# Patient Record
Sex: Male | Born: 1935 | Race: Black or African American | Hispanic: No | Marital: Married | Smoking: Former smoker
Health system: Southern US, Community
[De-identification: ages and names within clinical notes are randomized; demographics above are authoritative.]

## PROBLEM LIST (undated history)

## (undated) DIAGNOSIS — C3490 Malignant neoplasm of unspecified part of unspecified bronchus or lung: Secondary | ICD-10-CM

## (undated) DIAGNOSIS — G4733 Obstructive sleep apnea (adult) (pediatric): Secondary | ICD-10-CM

## (undated) DIAGNOSIS — I10 Essential (primary) hypertension: Secondary | ICD-10-CM

## (undated) DIAGNOSIS — M1990 Unspecified osteoarthritis, unspecified site: Secondary | ICD-10-CM

## (undated) DIAGNOSIS — I509 Heart failure, unspecified: Secondary | ICD-10-CM

## (undated) DIAGNOSIS — R1310 Dysphagia, unspecified: Secondary | ICD-10-CM

## (undated) DIAGNOSIS — E039 Hypothyroidism, unspecified: Secondary | ICD-10-CM

## (undated) DIAGNOSIS — N400 Enlarged prostate without lower urinary tract symptoms: Secondary | ICD-10-CM

## (undated) DIAGNOSIS — N189 Chronic kidney disease, unspecified: Secondary | ICD-10-CM

## (undated) DIAGNOSIS — K219 Gastro-esophageal reflux disease without esophagitis: Secondary | ICD-10-CM

## (undated) DIAGNOSIS — D649 Anemia, unspecified: Secondary | ICD-10-CM

## (undated) DIAGNOSIS — M109 Gout, unspecified: Secondary | ICD-10-CM

## (undated) DIAGNOSIS — E785 Hyperlipidemia, unspecified: Secondary | ICD-10-CM

## (undated) HISTORY — DX: Chronic kidney disease, unspecified: N18.9

## (undated) HISTORY — DX: Hyperlipidemia, unspecified: E78.5

## (undated) HISTORY — DX: Heart failure, unspecified: I50.9

## (undated) HISTORY — DX: Unspecified osteoarthritis, unspecified site: M19.90

## (undated) HISTORY — PX: CARDIAC CATHETERIZATION: SHX172

## (undated) HISTORY — PX: OTHER SURGICAL HISTORY: SHX169

## (undated) HISTORY — DX: Gout, unspecified: M10.9

## (undated) HISTORY — PX: THYROIDECTOMY: SHX17

## (undated) HISTORY — DX: Benign prostatic hyperplasia without lower urinary tract symptoms: N40.0

## (undated) HISTORY — DX: Essential (primary) hypertension: I10

## (undated) HISTORY — DX: Gastro-esophageal reflux disease without esophagitis: K21.9

## (undated) HISTORY — DX: Hypothyroidism, unspecified: E03.9

## (undated) HISTORY — DX: Obstructive sleep apnea (adult) (pediatric): G47.33

## (undated) HISTORY — DX: Malignant neoplasm of unspecified part of unspecified bronchus or lung: C34.90

## (undated) HISTORY — DX: Dysphagia, unspecified: R13.10

## (undated) HISTORY — PX: BILATERAL CARPAL TUNNEL RELEASE: SHX6508

## (undated) HISTORY — DX: Anemia, unspecified: D64.9

---

## 2005-05-19 ENCOUNTER — Ambulatory Visit: Payer: Self-pay | Admitting: Urology

## 2005-08-18 ENCOUNTER — Ambulatory Visit: Payer: Self-pay | Admitting: Otolaryngology

## 2005-10-28 ENCOUNTER — Ambulatory Visit: Payer: Self-pay | Admitting: Family Medicine

## 2005-11-20 ENCOUNTER — Ambulatory Visit: Payer: Self-pay | Admitting: Family Medicine

## 2005-12-08 ENCOUNTER — Ambulatory Visit: Payer: Self-pay | Admitting: Gastroenterology

## 2006-09-29 ENCOUNTER — Ambulatory Visit: Payer: Self-pay | Admitting: Family Medicine

## 2006-10-11 ENCOUNTER — Other Ambulatory Visit: Payer: Self-pay

## 2006-10-11 ENCOUNTER — Inpatient Hospital Stay: Payer: Self-pay | Admitting: Internal Medicine

## 2006-10-17 ENCOUNTER — Ambulatory Visit: Payer: Self-pay | Admitting: Gastroenterology

## 2007-02-09 ENCOUNTER — Ambulatory Visit: Payer: Self-pay | Admitting: Family Medicine

## 2007-04-05 ENCOUNTER — Ambulatory Visit: Payer: Self-pay | Admitting: Gastroenterology

## 2007-05-25 ENCOUNTER — Ambulatory Visit: Payer: Self-pay | Admitting: Family Medicine

## 2008-04-24 ENCOUNTER — Ambulatory Visit: Payer: Self-pay | Admitting: Otolaryngology

## 2008-05-02 ENCOUNTER — Ambulatory Visit: Payer: Self-pay | Admitting: Otolaryngology

## 2008-08-05 ENCOUNTER — Inpatient Hospital Stay: Payer: Self-pay | Admitting: Internal Medicine

## 2010-10-14 ENCOUNTER — Ambulatory Visit: Payer: Self-pay | Admitting: Vascular Surgery

## 2010-10-21 ENCOUNTER — Ambulatory Visit: Payer: Self-pay | Admitting: Vascular Surgery

## 2010-11-04 ENCOUNTER — Inpatient Hospital Stay: Payer: Self-pay | Admitting: Vascular Surgery

## 2011-05-26 ENCOUNTER — Ambulatory Visit: Payer: Self-pay | Admitting: Physical Medicine and Rehabilitation

## 2012-04-12 ENCOUNTER — Ambulatory Visit: Payer: Self-pay | Admitting: Otolaryngology

## 2012-06-26 ENCOUNTER — Inpatient Hospital Stay: Payer: Self-pay | Admitting: Student

## 2012-06-26 LAB — CBC WITH DIFFERENTIAL/PLATELET
Basophil %: 0.5 %
Eosinophil #: 1.3 10*3/uL — ABNORMAL HIGH (ref 0.0–0.7)
Eosinophil %: 13.1 %
HGB: 11.1 g/dL — ABNORMAL LOW (ref 13.0–18.0)
Lymphocyte #: 1.3 10*3/uL (ref 1.0–3.6)
MCH: 32.3 pg (ref 26.0–34.0)
MCHC: 33.4 g/dL (ref 32.0–36.0)
MCV: 97 fL (ref 80–100)
Monocyte #: 0.7 x10 3/mm (ref 0.2–1.0)
RBC: 3.43 10*6/uL — ABNORMAL LOW (ref 4.40–5.90)

## 2012-06-26 LAB — BASIC METABOLIC PANEL
Anion Gap: 7 (ref 7–16)
BUN: 37 mg/dL — ABNORMAL HIGH (ref 7–18)
Creatinine: 1.62 mg/dL — ABNORMAL HIGH (ref 0.60–1.30)
EGFR (African American): 44 — ABNORMAL LOW
EGFR (Non-African Amer.): 38 — ABNORMAL LOW
Glucose: 104 mg/dL — ABNORMAL HIGH (ref 65–99)
Osmolality: 294 (ref 275–301)
Potassium: 3.7 mmol/L (ref 3.5–5.1)

## 2012-09-28 ENCOUNTER — Ambulatory Visit: Payer: Self-pay | Admitting: Gastroenterology

## 2012-10-25 ENCOUNTER — Ambulatory Visit: Payer: Self-pay | Admitting: Otolaryngology

## 2013-02-26 ENCOUNTER — Other Ambulatory Visit: Payer: Self-pay | Admitting: Gastroenterology

## 2013-02-26 DIAGNOSIS — R1310 Dysphagia, unspecified: Secondary | ICD-10-CM

## 2013-03-13 ENCOUNTER — Ambulatory Visit
Admission: RE | Admit: 2013-03-13 | Discharge: 2013-03-13 | Disposition: A | Discharge status: Home / Self Care | Payer: Medicare Other | Source: Ambulatory Visit | Attending: Gastroenterology | Admitting: Gastroenterology

## 2013-03-13 DIAGNOSIS — R1310 Dysphagia, unspecified: Secondary | ICD-10-CM

## 2013-03-28 ENCOUNTER — Ambulatory Visit: Payer: Self-pay | Admitting: Family

## 2013-04-16 ENCOUNTER — Encounter: Payer: Self-pay | Admitting: Family

## 2013-04-18 ENCOUNTER — Encounter: Payer: Self-pay | Admitting: Family

## 2013-05-19 ENCOUNTER — Emergency Department: Payer: Self-pay | Admitting: Emergency Medicine

## 2013-05-19 LAB — URINALYSIS, COMPLETE
Bilirubin,UR: NEGATIVE
Ketone: NEGATIVE
Nitrite: NEGATIVE
Ph: 5 (ref 4.5–8.0)
Protein: 30
WBC UR: 976 /HPF (ref 0–5)

## 2013-05-19 LAB — CBC WITH DIFFERENTIAL/PLATELET
Basophil #: 0.1 10*3/uL (ref 0.0–0.1)
Eosinophil %: 2.6 %
Lymphocyte #: 1.8 10*3/uL (ref 1.0–3.6)
MCV: 93 fL (ref 80–100)
Monocyte #: 1.1 x10 3/mm — ABNORMAL HIGH (ref 0.2–1.0)
Monocyte %: 9.2 %
Neutrophil %: 71.8 %
Platelet: 104 10*3/uL — ABNORMAL LOW (ref 150–440)
RBC: 3.96 10*6/uL — ABNORMAL LOW (ref 4.40–5.90)
RDW: 15.9 % — ABNORMAL HIGH (ref 11.5–14.5)
WBC: 11.5 10*3/uL — ABNORMAL HIGH (ref 3.8–10.6)

## 2013-05-19 LAB — BASIC METABOLIC PANEL
Anion Gap: 5 — ABNORMAL LOW (ref 7–16)
BUN: 49 mg/dL — ABNORMAL HIGH (ref 7–18)
Calcium, Total: 9.1 mg/dL (ref 8.5–10.1)
Chloride: 104 mmol/L (ref 98–107)
Co2: 29 mmol/L (ref 21–32)
Creatinine: 2.1 mg/dL — ABNORMAL HIGH (ref 0.60–1.30)
EGFR (African American): 32 — ABNORMAL LOW
EGFR (Non-African Amer.): 27 — ABNORMAL LOW
Glucose: 84 mg/dL (ref 65–99)
Osmolality: 288 (ref 275–301)
Potassium: 4.2 mmol/L (ref 3.5–5.1)
Sodium: 138 mmol/L (ref 136–145)

## 2013-08-15 ENCOUNTER — Ambulatory Visit: Payer: Self-pay | Admitting: Cardiology

## 2013-08-15 LAB — CBC WITH DIFFERENTIAL/PLATELET
BASOS ABS: 0.1 10*3/uL (ref 0.0–0.1)
Basophil %: 0.5 %
EOS PCT: 1.8 %
Eosinophil #: 0.2 10*3/uL (ref 0.0–0.7)
HCT: 43.2 % (ref 40.0–52.0)
HGB: 14 g/dL (ref 13.0–18.0)
Lymphocyte #: 2.1 10*3/uL (ref 1.0–3.6)
Lymphocyte %: 18.2 %
MCH: 31.1 pg (ref 26.0–34.0)
MCHC: 32.4 g/dL (ref 32.0–36.0)
MCV: 96 fL (ref 80–100)
MONO ABS: 0.9 x10 3/mm (ref 0.2–1.0)
MONOS PCT: 7.8 %
NEUTROS ABS: 8.2 10*3/uL — AB (ref 1.4–6.5)
Neutrophil %: 71.7 %
PLATELETS: 104 10*3/uL — AB (ref 150–440)
RBC: 4.5 10*6/uL (ref 4.40–5.90)
RDW: 16.1 % — ABNORMAL HIGH (ref 11.5–14.5)
WBC: 11.4 10*3/uL — ABNORMAL HIGH (ref 3.8–10.6)

## 2013-08-15 LAB — BASIC METABOLIC PANEL
ANION GAP: 8 (ref 7–16)
BUN: 29 mg/dL — ABNORMAL HIGH (ref 7–18)
CHLORIDE: 106 mmol/L (ref 98–107)
CREATININE: 1.36 mg/dL — AB (ref 0.60–1.30)
Calcium, Total: 9.5 mg/dL (ref 8.5–10.1)
Co2: 26 mmol/L (ref 21–32)
EGFR (African American): 54 — ABNORMAL LOW
EGFR (Non-African Amer.): 46 — ABNORMAL LOW
GLUCOSE: 95 mg/dL (ref 65–99)
Osmolality: 285 (ref 275–301)
Potassium: 4.2 mmol/L (ref 3.5–5.1)
Sodium: 140 mmol/L (ref 136–145)

## 2013-08-16 LAB — CBC WITH DIFFERENTIAL/PLATELET
BASOS PCT: 0.4 %
Basophil #: 0 10*3/uL (ref 0.0–0.1)
Eosinophil #: 0.2 10*3/uL (ref 0.0–0.7)
Eosinophil %: 1.9 %
HCT: 40.9 % (ref 40.0–52.0)
HGB: 13.5 g/dL (ref 13.0–18.0)
LYMPHS ABS: 1.1 10*3/uL (ref 1.0–3.6)
Lymphocyte %: 9.8 %
MCH: 31.7 pg (ref 26.0–34.0)
MCHC: 33.1 g/dL (ref 32.0–36.0)
MCV: 96 fL (ref 80–100)
Monocyte #: 0.9 x10 3/mm (ref 0.2–1.0)
Monocyte %: 7.6 %
Neutrophil #: 9.2 10*3/uL — ABNORMAL HIGH (ref 1.4–6.5)
Neutrophil %: 80.3 %
Platelet: 112 10*3/uL — ABNORMAL LOW (ref 150–440)
RBC: 4.26 10*6/uL — AB (ref 4.40–5.90)
RDW: 16.2 % — ABNORMAL HIGH (ref 11.5–14.5)
WBC: 11.5 10*3/uL — ABNORMAL HIGH (ref 3.8–10.6)

## 2013-08-16 LAB — BASIC METABOLIC PANEL
Anion Gap: 4 — ABNORMAL LOW (ref 7–16)
BUN: 26 mg/dL — ABNORMAL HIGH (ref 7–18)
CALCIUM: 9.1 mg/dL (ref 8.5–10.1)
CREATININE: 1.52 mg/dL — AB (ref 0.60–1.30)
Chloride: 106 mmol/L (ref 98–107)
Co2: 28 mmol/L (ref 21–32)
EGFR (African American): 47 — ABNORMAL LOW
EGFR (Non-African Amer.): 41 — ABNORMAL LOW
GLUCOSE: 102 mg/dL — AB (ref 65–99)
Osmolality: 281 (ref 275–301)
Potassium: 4.1 mmol/L (ref 3.5–5.1)
Sodium: 138 mmol/L (ref 136–145)

## 2013-10-01 ENCOUNTER — Inpatient Hospital Stay: Payer: Self-pay | Admitting: Internal Medicine

## 2013-10-01 LAB — CBC
HCT: 38.8 % — ABNORMAL LOW (ref 40.0–52.0)
HGB: 12.5 g/dL — AB (ref 13.0–18.0)
MCH: 30.3 pg (ref 26.0–34.0)
MCHC: 32.2 g/dL (ref 32.0–36.0)
MCV: 94 fL (ref 80–100)
PLATELETS: 134 10*3/uL — AB (ref 150–440)
RBC: 4.11 10*6/uL — ABNORMAL LOW (ref 4.40–5.90)
RDW: 16.3 % — ABNORMAL HIGH (ref 11.5–14.5)
WBC: 13.3 10*3/uL — ABNORMAL HIGH (ref 3.8–10.6)

## 2013-10-01 LAB — CK-MB
CK-MB: 3.3 ng/mL (ref 0.5–3.6)
CK-MB: 3.5 ng/mL (ref 0.5–3.6)
CK-MB: 3 ng/mL (ref 0.5–3.6)

## 2013-10-01 LAB — BASIC METABOLIC PANEL
Anion Gap: 6 — ABNORMAL LOW (ref 7–16)
BUN: 23 mg/dL — ABNORMAL HIGH (ref 7–18)
CREATININE: 1.51 mg/dL — AB (ref 0.60–1.30)
Calcium, Total: 8.9 mg/dL (ref 8.5–10.1)
Chloride: 108 mmol/L — ABNORMAL HIGH (ref 98–107)
Co2: 28 mmol/L (ref 21–32)
EGFR (African American): 47 — ABNORMAL LOW
EGFR (Non-African Amer.): 41 — ABNORMAL LOW
Glucose: 148 mg/dL — ABNORMAL HIGH (ref 65–99)
OSMOLALITY: 290 (ref 275–301)
Potassium: 3.9 mmol/L (ref 3.5–5.1)
Sodium: 142 mmol/L (ref 136–145)

## 2013-10-01 LAB — PRO B NATRIURETIC PEPTIDE: B-Type Natriuretic Peptide: 6726 pg/mL — ABNORMAL HIGH (ref 0–450)

## 2013-10-01 LAB — TSH: THYROID STIMULATING HORM: 1.44 u[IU]/mL

## 2013-10-01 LAB — APTT: Activated PTT: 32.1 secs (ref 23.6–35.9)

## 2013-10-01 LAB — TROPONIN I
Troponin-I: 0.12 ng/mL — ABNORMAL HIGH
Troponin-I: 0.27 ng/mL — ABNORMAL HIGH
Troponin-I: 0.38 ng/mL — ABNORMAL HIGH

## 2013-10-02 LAB — COMPREHENSIVE METABOLIC PANEL
ALK PHOS: 127 U/L — AB
ALT: 52 U/L (ref 12–78)
Albumin: 2.6 g/dL — ABNORMAL LOW (ref 3.4–5.0)
Anion Gap: 6 — ABNORMAL LOW (ref 7–16)
BILIRUBIN TOTAL: 1 mg/dL (ref 0.2–1.0)
BUN: 28 mg/dL — ABNORMAL HIGH (ref 7–18)
CHLORIDE: 108 mmol/L — AB (ref 98–107)
CO2: 27 mmol/L (ref 21–32)
Calcium, Total: 8.4 mg/dL — ABNORMAL LOW (ref 8.5–10.1)
Creatinine: 1.69 mg/dL — ABNORMAL HIGH (ref 0.60–1.30)
EGFR (African American): 41 — ABNORMAL LOW
EGFR (Non-African Amer.): 35 — ABNORMAL LOW
Glucose: 81 mg/dL (ref 65–99)
Osmolality: 286 (ref 275–301)
Potassium: 3.8 mmol/L (ref 3.5–5.1)
SGOT(AST): 39 U/L — ABNORMAL HIGH (ref 15–37)
SODIUM: 141 mmol/L (ref 136–145)
Total Protein: 6.8 g/dL (ref 6.4–8.2)

## 2013-10-02 LAB — CBC WITH DIFFERENTIAL/PLATELET
BASOS ABS: 0 10*3/uL (ref 0.0–0.1)
Basophil %: 0.3 %
EOS PCT: 0.7 %
Eosinophil #: 0.1 10*3/uL (ref 0.0–0.7)
HCT: 36.4 % — ABNORMAL LOW (ref 40.0–52.0)
HGB: 11.4 g/dL — ABNORMAL LOW (ref 13.0–18.0)
LYMPHS ABS: 1.2 10*3/uL (ref 1.0–3.6)
Lymphocyte %: 11 %
MCH: 29.1 pg (ref 26.0–34.0)
MCHC: 31.3 g/dL — AB (ref 32.0–36.0)
MCV: 93 fL (ref 80–100)
MONO ABS: 0.9 x10 3/mm (ref 0.2–1.0)
Monocyte %: 8.7 %
NEUTROS PCT: 79.3 %
Neutrophil #: 8.6 10*3/uL — ABNORMAL HIGH (ref 1.4–6.5)
Platelet: 115 10*3/uL — ABNORMAL LOW (ref 150–440)
RBC: 3.92 10*6/uL — AB (ref 4.40–5.90)
RDW: 16.2 % — AB (ref 11.5–14.5)
WBC: 10.8 10*3/uL — ABNORMAL HIGH (ref 3.8–10.6)

## 2013-10-02 LAB — LIPID PANEL
Cholesterol: 82 mg/dL (ref 0–200)
HDL Cholesterol: 58 mg/dL (ref 40–60)
LDL CHOLESTEROL, CALC: 13 mg/dL (ref 0–100)
TRIGLYCERIDES: 56 mg/dL (ref 0–200)
VLDL CHOLESTEROL, CALC: 11 mg/dL (ref 5–40)

## 2013-10-02 LAB — APTT
ACTIVATED PTT: 88.4 s — AB (ref 23.6–35.9)
ACTIVATED PTT: 85.2 s — AB (ref 23.6–35.9)

## 2013-10-02 LAB — PRO B NATRIURETIC PEPTIDE: B-Type Natriuretic Peptide: 24608 pg/mL — ABNORMAL HIGH (ref 0–450)

## 2013-10-03 ENCOUNTER — Ambulatory Visit: Payer: Self-pay | Admitting: Internal Medicine

## 2013-10-03 LAB — APTT: Activated PTT: 68.5 secs — ABNORMAL HIGH (ref 23.6–35.9)

## 2013-10-03 LAB — PHOSPHORUS: Phosphorus: 3.8 mg/dL (ref 2.5–4.9)

## 2013-10-03 LAB — HEPATIC FUNCTION PANEL A (ARMC)
ALK PHOS: 120 U/L — AB
Albumin: 2.3 g/dL — ABNORMAL LOW (ref 3.4–5.0)
Bilirubin, Direct: 0.8 mg/dL — ABNORMAL HIGH (ref 0.00–0.20)
Bilirubin,Total: 1.4 mg/dL — ABNORMAL HIGH (ref 0.2–1.0)
SGOT(AST): 24 U/L (ref 15–37)
SGPT (ALT): 40 U/L (ref 12–78)
TOTAL PROTEIN: 6.5 g/dL (ref 6.4–8.2)

## 2013-10-03 LAB — CBC WITH DIFFERENTIAL/PLATELET
BASOS ABS: 0 10*3/uL (ref 0.0–0.1)
Basophil %: 0.2 %
Eosinophil #: 0 10*3/uL (ref 0.0–0.7)
Eosinophil %: 0.1 %
HCT: 35 % — ABNORMAL LOW (ref 40.0–52.0)
HGB: 11.4 g/dL — AB (ref 13.0–18.0)
Lymphocyte #: 0.3 10*3/uL — ABNORMAL LOW (ref 1.0–3.6)
Lymphocyte %: 2.9 %
MCH: 30.1 pg (ref 26.0–34.0)
MCHC: 32.6 g/dL (ref 32.0–36.0)
MCV: 92 fL (ref 80–100)
MONO ABS: 0.3 x10 3/mm (ref 0.2–1.0)
Monocyte %: 2.8 %
NEUTROS ABS: 9.8 10*3/uL — AB (ref 1.4–6.5)
NEUTROS PCT: 94 %
Platelet: 105 10*3/uL — ABNORMAL LOW (ref 150–440)
RBC: 3.79 10*6/uL — ABNORMAL LOW (ref 4.40–5.90)
RDW: 16.4 % — ABNORMAL HIGH (ref 11.5–14.5)
WBC: 10.4 10*3/uL (ref 3.8–10.6)

## 2013-10-03 LAB — BASIC METABOLIC PANEL
Anion Gap: 11 (ref 7–16)
BUN: 30 mg/dL — ABNORMAL HIGH (ref 7–18)
CO2: 24 mmol/L (ref 21–32)
Calcium, Total: 8.5 mg/dL (ref 8.5–10.1)
Chloride: 104 mmol/L (ref 98–107)
Creatinine: 1.79 mg/dL — ABNORMAL HIGH (ref 0.60–1.30)
GFR CALC AF AMER: 38 — AB
GFR CALC NON AF AMER: 33 — AB
Glucose: 152 mg/dL — ABNORMAL HIGH (ref 65–99)
Osmolality: 287 (ref 275–301)
Potassium: 3.3 mmol/L — ABNORMAL LOW (ref 3.5–5.1)
Sodium: 139 mmol/L (ref 136–145)

## 2013-10-03 LAB — LACTATE DEHYDROGENASE: LDH: 185 U/L (ref 85–241)

## 2013-10-03 LAB — PLATELET COUNT: Platelet: 101 10*3/uL — ABNORMAL LOW (ref 150–440)

## 2013-10-03 LAB — MAGNESIUM: MAGNESIUM: 1.6 mg/dL — AB

## 2013-10-04 LAB — CBC WITH DIFFERENTIAL/PLATELET
Basophil #: 0 10*3/uL (ref 0.0–0.1)
Basophil %: 0 %
Eosinophil #: 0 10*3/uL (ref 0.0–0.7)
Eosinophil %: 0 %
HCT: 31 % — ABNORMAL LOW (ref 40.0–52.0)
HGB: 10.2 g/dL — ABNORMAL LOW (ref 13.0–18.0)
LYMPHS PCT: 2.3 %
Lymphocyte #: 0.3 10*3/uL — ABNORMAL LOW (ref 1.0–3.6)
MCH: 30 pg (ref 26.0–34.0)
MCHC: 32.9 g/dL (ref 32.0–36.0)
MCV: 91 fL (ref 80–100)
MONOS PCT: 4.1 %
Monocyte #: 0.5 x10 3/mm (ref 0.2–1.0)
NEUTROS PCT: 93.6 %
Neutrophil #: 11.6 10*3/uL — ABNORMAL HIGH (ref 1.4–6.5)
Platelet: 107 10*3/uL — ABNORMAL LOW (ref 150–440)
RBC: 3.4 10*6/uL — AB (ref 4.40–5.90)
RDW: 16.3 % — AB (ref 11.5–14.5)
WBC: 12.4 10*3/uL — ABNORMAL HIGH (ref 3.8–10.6)

## 2013-10-04 LAB — BASIC METABOLIC PANEL
Anion Gap: 7 (ref 7–16)
BUN: 39 mg/dL — ABNORMAL HIGH (ref 7–18)
CALCIUM: 8 mg/dL — AB (ref 8.5–10.1)
CHLORIDE: 103 mmol/L (ref 98–107)
CO2: 27 mmol/L (ref 21–32)
CREATININE: 1.74 mg/dL — AB (ref 0.60–1.30)
EGFR (Non-African Amer.): 34 — ABNORMAL LOW
GFR CALC AF AMER: 40 — AB
GLUCOSE: 156 mg/dL — AB (ref 65–99)
OSMOLALITY: 286 (ref 275–301)
Potassium: 3.2 mmol/L — ABNORMAL LOW (ref 3.5–5.1)
SODIUM: 137 mmol/L (ref 136–145)

## 2013-10-04 LAB — MAGNESIUM: MAGNESIUM: 2.1 mg/dL

## 2013-10-04 LAB — APTT: Activated PTT: 32.1 secs (ref 23.6–35.9)

## 2013-10-04 LAB — PHOSPHORUS: Phosphorus: 4 mg/dL (ref 2.5–4.9)

## 2013-10-05 LAB — BASIC METABOLIC PANEL
Anion Gap: 7 (ref 7–16)
BUN: 46 mg/dL — AB (ref 7–18)
CHLORIDE: 106 mmol/L (ref 98–107)
CREATININE: 1.78 mg/dL — AB (ref 0.60–1.30)
Calcium, Total: 8.3 mg/dL — ABNORMAL LOW (ref 8.5–10.1)
Co2: 27 mmol/L (ref 21–32)
EGFR (African American): 39 — ABNORMAL LOW
GFR CALC NON AF AMER: 33 — AB
GLUCOSE: 131 mg/dL — AB (ref 65–99)
Osmolality: 293 (ref 275–301)
Potassium: 3.7 mmol/L (ref 3.5–5.1)
SODIUM: 140 mmol/L (ref 136–145)

## 2013-10-05 LAB — CBC WITH DIFFERENTIAL/PLATELET
Basophil #: 0 10*3/uL (ref 0.0–0.1)
Basophil %: 0.1 %
Eosinophil #: 0 10*3/uL (ref 0.0–0.7)
Eosinophil %: 0 %
HCT: 38.1 % — ABNORMAL LOW (ref 40.0–52.0)
HGB: 12.3 g/dL — ABNORMAL LOW (ref 13.0–18.0)
LYMPHS PCT: 2.1 %
Lymphocyte #: 0.3 10*3/uL — ABNORMAL LOW (ref 1.0–3.6)
MCH: 29.7 pg (ref 26.0–34.0)
MCHC: 32.4 g/dL (ref 32.0–36.0)
MCV: 92 fL (ref 80–100)
Monocyte #: 0.6 x10 3/mm (ref 0.2–1.0)
Monocyte %: 4 %
NEUTROS PCT: 93.8 %
Neutrophil #: 14.5 10*3/uL — ABNORMAL HIGH (ref 1.4–6.5)
Platelet: 141 10*3/uL — ABNORMAL LOW (ref 150–440)
RBC: 4.14 10*6/uL — AB (ref 4.40–5.90)
RDW: 16.4 % — AB (ref 11.5–14.5)
WBC: 15.4 10*3/uL — AB (ref 3.8–10.6)

## 2013-10-05 LAB — MAGNESIUM: MAGNESIUM: 2.3 mg/dL

## 2013-10-05 LAB — EXPECTORATED SPUTUM ASSESSMENT W GRAM STAIN, RFLX TO RESP C

## 2013-10-06 LAB — BASIC METABOLIC PANEL
ANION GAP: 6 — AB (ref 7–16)
BUN: 53 mg/dL — AB (ref 7–18)
CO2: 30 mmol/L (ref 21–32)
Calcium, Total: 8.5 mg/dL (ref 8.5–10.1)
Chloride: 107 mmol/L (ref 98–107)
Creatinine: 1.96 mg/dL — ABNORMAL HIGH (ref 0.60–1.30)
EGFR (African American): 34 — ABNORMAL LOW
EGFR (Non-African Amer.): 30 — ABNORMAL LOW
Glucose: 175 mg/dL — ABNORMAL HIGH (ref 65–99)
OSMOLALITY: 304 (ref 275–301)
POTASSIUM: 3.5 mmol/L (ref 3.5–5.1)
SODIUM: 143 mmol/L (ref 136–145)

## 2013-10-06 LAB — CBC WITH DIFFERENTIAL/PLATELET
Basophil #: 0 10*3/uL (ref 0.0–0.1)
Basophil %: 0.1 %
Eosinophil #: 0 10*3/uL (ref 0.0–0.7)
Eosinophil %: 0 %
HCT: 39 % — ABNORMAL LOW (ref 40.0–52.0)
HGB: 12.6 g/dL — ABNORMAL LOW (ref 13.0–18.0)
Lymphocyte #: 0.7 10*3/uL — ABNORMAL LOW (ref 1.0–3.6)
Lymphocyte %: 5.4 %
MCH: 29.7 pg (ref 26.0–34.0)
MCHC: 32.2 g/dL (ref 32.0–36.0)
MCV: 92 fL (ref 80–100)
MONO ABS: 1.4 x10 3/mm — AB (ref 0.2–1.0)
Monocyte %: 9.7 %
Neutrophil #: 11.8 10*3/uL — ABNORMAL HIGH (ref 1.4–6.5)
Neutrophil %: 84.8 %
PLATELETS: 150 10*3/uL (ref 150–440)
RBC: 4.23 10*6/uL — AB (ref 4.40–5.90)
RDW: 16.4 % — AB (ref 11.5–14.5)
WBC: 13.9 10*3/uL — AB (ref 3.8–10.6)

## 2013-10-06 LAB — CULTURE, BLOOD (SINGLE)

## 2013-10-06 LAB — MAGNESIUM: Magnesium: 2.1 mg/dL

## 2013-10-06 LAB — PHOSPHORUS: Phosphorus: 2.2 mg/dL — ABNORMAL LOW (ref 2.5–4.9)

## 2013-10-08 LAB — BASIC METABOLIC PANEL
Anion Gap: 8 (ref 7–16)
BUN: 45 mg/dL — ABNORMAL HIGH (ref 7–18)
CALCIUM: 8.3 mg/dL — AB (ref 8.5–10.1)
Chloride: 106 mmol/L (ref 98–107)
Co2: 33 mmol/L — ABNORMAL HIGH (ref 21–32)
Creatinine: 1.56 mg/dL — ABNORMAL HIGH (ref 0.60–1.30)
EGFR (African American): 45 — ABNORMAL LOW
EGFR (Non-African Amer.): 39 — ABNORMAL LOW
GLUCOSE: 154 mg/dL — AB (ref 65–99)
OSMOLALITY: 307 (ref 275–301)
POTASSIUM: 3.6 mmol/L (ref 3.5–5.1)
Sodium: 147 mmol/L — ABNORMAL HIGH (ref 136–145)

## 2013-10-10 ENCOUNTER — Encounter: Payer: Self-pay | Admitting: Internal Medicine

## 2013-10-10 LAB — CBC WITH DIFFERENTIAL/PLATELET
Basophil #: 0 10*3/uL (ref 0.0–0.1)
Basophil %: 0.3 %
Eosinophil #: 0.2 10*3/uL (ref 0.0–0.7)
Eosinophil %: 1.3 %
HCT: 34.7 % — ABNORMAL LOW (ref 40.0–52.0)
HGB: 11.1 g/dL — ABNORMAL LOW (ref 13.0–18.0)
LYMPHS ABS: 1 10*3/uL (ref 1.0–3.6)
Lymphocyte %: 8 %
MCH: 29.9 pg (ref 26.0–34.0)
MCHC: 32.1 g/dL (ref 32.0–36.0)
MCV: 93 fL (ref 80–100)
Monocyte #: 1.3 x10 3/mm — ABNORMAL HIGH (ref 0.2–1.0)
Monocyte %: 10 %
Neutrophil #: 10.3 10*3/uL — ABNORMAL HIGH (ref 1.4–6.5)
Neutrophil %: 80.4 %
Platelet: 113 10*3/uL — ABNORMAL LOW (ref 150–440)
RBC: 3.73 10*6/uL — AB (ref 4.40–5.90)
RDW: 16.8 % — ABNORMAL HIGH (ref 11.5–14.5)
WBC: 12.8 10*3/uL — AB (ref 3.8–10.6)

## 2013-10-10 LAB — BASIC METABOLIC PANEL
Anion Gap: 5 — ABNORMAL LOW (ref 7–16)
BUN: 61 mg/dL — ABNORMAL HIGH (ref 7–18)
CALCIUM: 8.5 mg/dL (ref 8.5–10.1)
CREATININE: 2.13 mg/dL — AB (ref 0.60–1.30)
Chloride: 108 mmol/L — ABNORMAL HIGH (ref 98–107)
Co2: 32 mmol/L (ref 21–32)
EGFR (Non-African Amer.): 27 — ABNORMAL LOW
GFR CALC AF AMER: 31 — AB
GLUCOSE: 99 mg/dL (ref 65–99)
OSMOLALITY: 306 (ref 275–301)
POTASSIUM: 3.6 mmol/L (ref 3.5–5.1)
SODIUM: 145 mmol/L (ref 136–145)

## 2013-10-17 ENCOUNTER — Encounter: Payer: Self-pay | Admitting: Internal Medicine

## 2013-10-17 ENCOUNTER — Ambulatory Visit: Payer: Self-pay | Admitting: Nurse Practitioner

## 2014-01-24 ENCOUNTER — Inpatient Hospital Stay: Payer: Self-pay | Admitting: Internal Medicine

## 2014-01-24 LAB — BASIC METABOLIC PANEL
Anion Gap: 7 (ref 7–16)
BUN: 26 mg/dL — ABNORMAL HIGH (ref 7–18)
CHLORIDE: 102 mmol/L (ref 98–107)
CO2: 32 mmol/L (ref 21–32)
Calcium, Total: 9 mg/dL (ref 8.5–10.1)
Creatinine: 1.68 mg/dL — ABNORMAL HIGH (ref 0.60–1.30)
EGFR (African American): 41 — ABNORMAL LOW
EGFR (Non-African Amer.): 36 — ABNORMAL LOW
GLUCOSE: 109 mg/dL — AB (ref 65–99)
OSMOLALITY: 287 (ref 275–301)
Potassium: 4.2 mmol/L (ref 3.5–5.1)
Sodium: 141 mmol/L (ref 136–145)

## 2014-01-24 LAB — CK TOTAL AND CKMB (NOT AT ARMC)
CK, TOTAL: 56 U/L
CK, TOTAL: 69 U/L
CK, Total: 59 U/L
CK-MB: 1.6 ng/mL (ref 0.5–3.6)
CK-MB: 1.8 ng/mL (ref 0.5–3.6)
CK-MB: 2.6 ng/mL (ref 0.5–3.6)

## 2014-01-24 LAB — TROPONIN I
TROPONIN-I: 0.13 ng/mL — AB
TROPONIN-I: 0.28 ng/mL — AB
Troponin-I: 0.17 ng/mL — ABNORMAL HIGH

## 2014-01-24 LAB — CBC
HCT: 35.1 % — ABNORMAL LOW (ref 40.0–52.0)
HGB: 11.3 g/dL — ABNORMAL LOW (ref 13.0–18.0)
MCH: 29.7 pg (ref 26.0–34.0)
MCHC: 32.2 g/dL (ref 32.0–36.0)
MCV: 92 fL (ref 80–100)
Platelet: 114 10*3/uL — ABNORMAL LOW (ref 150–440)
RBC: 3.81 10*6/uL — AB (ref 4.40–5.90)
RDW: 19.6 % — ABNORMAL HIGH (ref 11.5–14.5)
WBC: 9.2 10*3/uL (ref 3.8–10.6)

## 2014-01-24 LAB — PRO B NATRIURETIC PEPTIDE: B-Type Natriuretic Peptide: 13124 pg/mL — ABNORMAL HIGH (ref 0–450)

## 2014-01-24 LAB — APTT: ACTIVATED PTT: 31.8 s (ref 23.6–35.9)

## 2014-01-24 LAB — PROTIME-INR
INR: 1.4
Prothrombin Time: 17.2 secs — ABNORMAL HIGH (ref 11.5–14.7)

## 2014-01-25 LAB — CBC WITH DIFFERENTIAL/PLATELET
Basophil #: 0.1 10*3/uL (ref 0.0–0.1)
Basophil %: 0.5 %
EOS PCT: 0.6 %
Eosinophil #: 0.1 10*3/uL (ref 0.0–0.7)
HCT: 35.2 % — AB (ref 40.0–52.0)
HGB: 11.1 g/dL — ABNORMAL LOW (ref 13.0–18.0)
LYMPHS ABS: 1.2 10*3/uL (ref 1.0–3.6)
Lymphocyte %: 10.4 %
MCH: 29.5 pg (ref 26.0–34.0)
MCHC: 31.6 g/dL — ABNORMAL LOW (ref 32.0–36.0)
MCV: 93 fL (ref 80–100)
Monocyte #: 0.8 x10 3/mm (ref 0.2–1.0)
Monocyte %: 6.4 %
NEUTROS ABS: 9.6 10*3/uL — AB (ref 1.4–6.5)
NEUTROS PCT: 82.1 %
Platelet: 111 10*3/uL — ABNORMAL LOW (ref 150–440)
RBC: 3.78 10*6/uL — ABNORMAL LOW (ref 4.40–5.90)
RDW: 19.3 % — ABNORMAL HIGH (ref 11.5–14.5)
WBC: 11.7 10*3/uL — ABNORMAL HIGH (ref 3.8–10.6)

## 2014-01-25 LAB — BASIC METABOLIC PANEL
ANION GAP: 7 (ref 7–16)
BUN: 27 mg/dL — ABNORMAL HIGH (ref 7–18)
CO2: 33 mmol/L — AB (ref 21–32)
Calcium, Total: 8.8 mg/dL (ref 8.5–10.1)
Chloride: 103 mmol/L (ref 98–107)
Creatinine: 1.64 mg/dL — ABNORMAL HIGH (ref 0.60–1.30)
EGFR (African American): 43 — ABNORMAL LOW
GFR CALC NON AF AMER: 37 — AB
GLUCOSE: 106 mg/dL — AB (ref 65–99)
Osmolality: 291 (ref 275–301)
Potassium: 3.8 mmol/L (ref 3.5–5.1)
Sodium: 143 mmol/L (ref 136–145)

## 2014-01-25 LAB — HEPARIN LEVEL (UNFRACTIONATED)
ANTI-XA(UNFRACTIONATED): 0.65 [IU]/mL (ref 0.30–0.70)
Anti-Xa(Unfractionated): 0.16 IU/mL — ABNORMAL LOW (ref 0.30–0.70)

## 2014-01-26 LAB — CBC WITH DIFFERENTIAL/PLATELET
BASOS ABS: 0.1 10*3/uL (ref 0.0–0.1)
BASOS PCT: 0.7 %
Basophil #: 0 10*3/uL (ref 0.0–0.1)
Basophil %: 0.5 %
EOS ABS: 0.5 10*3/uL (ref 0.0–0.7)
EOS PCT: 5.6 %
EOS PCT: 6.1 %
Eosinophil #: 0.5 10*3/uL (ref 0.0–0.7)
HCT: 32.7 % — ABNORMAL LOW (ref 40.0–52.0)
HCT: 30.9 % — ABNORMAL LOW (ref 40.0–52.0)
HGB: 10.3 g/dL — ABNORMAL LOW (ref 13.0–18.0)
HGB: 9.9 g/dL — ABNORMAL LOW (ref 13.0–18.0)
LYMPHS ABS: 1 10*3/uL (ref 1.0–3.6)
Lymphocyte #: 1.2 10*3/uL (ref 1.0–3.6)
Lymphocyte %: 13.3 %
Lymphocyte %: 14.2 %
MCH: 29 pg (ref 26.0–34.0)
MCH: 29 pg (ref 26.0–34.0)
MCHC: 31.5 g/dL — AB (ref 32.0–36.0)
MCHC: 32.1 g/dL (ref 32.0–36.0)
MCV: 92 fL (ref 80–100)
MCV: 90 fL (ref 80–100)
MONOS PCT: 8.1 %
MONOS PCT: 8.9 %
Monocyte #: 0.7 x10 3/mm (ref 0.2–1.0)
Monocyte #: 0.7 x10 3/mm (ref 0.2–1.0)
NEUTROS ABS: 5.2 10*3/uL (ref 1.4–6.5)
NEUTROS PCT: 72.3 %
Neutrophil #: 6.4 10*3/uL (ref 1.4–6.5)
Neutrophil %: 70.3 %
PLATELETS: 101 10*3/uL — AB (ref 150–440)
Platelet: 106 10*3/uL — ABNORMAL LOW (ref 150–440)
RBC: 3.55 10*6/uL — ABNORMAL LOW (ref 4.40–5.90)
RBC: 3.41 10*6/uL — ABNORMAL LOW (ref 4.40–5.90)
RDW: 18.9 % — ABNORMAL HIGH (ref 11.5–14.5)
RDW: 19.1 % — ABNORMAL HIGH (ref 11.5–14.5)
WBC: 8.8 10*3/uL (ref 3.8–10.6)
WBC: 7.4 10*3/uL (ref 3.8–10.6)

## 2014-01-26 LAB — BASIC METABOLIC PANEL
ANION GAP: 7 (ref 7–16)
BUN: 30 mg/dL — AB (ref 7–18)
CALCIUM: 8.6 mg/dL (ref 8.5–10.1)
CO2: 34 mmol/L — AB (ref 21–32)
Chloride: 101 mmol/L (ref 98–107)
Creatinine: 1.73 mg/dL — ABNORMAL HIGH (ref 0.60–1.30)
EGFR (Non-African Amer.): 34 — ABNORMAL LOW
GFR CALC AF AMER: 40 — AB
GLUCOSE: 98 mg/dL (ref 65–99)
Osmolality: 289 (ref 275–301)
POTASSIUM: 3.4 mmol/L — AB (ref 3.5–5.1)
SODIUM: 142 mmol/L (ref 136–145)

## 2014-01-26 LAB — HEPARIN LEVEL (UNFRACTIONATED): Anti-Xa(Unfractionated): 0.96 IU/mL — ABNORMAL HIGH (ref 0.30–0.70)

## 2014-01-27 LAB — CBC WITH DIFFERENTIAL/PLATELET
Basophil #: 0 10*3/uL (ref 0.0–0.1)
Basophil %: 0.5 %
Eosinophil #: 0.4 10*3/uL (ref 0.0–0.7)
Eosinophil %: 5.9 %
HCT: 29.8 % — ABNORMAL LOW (ref 40.0–52.0)
HGB: 9.8 g/dL — ABNORMAL LOW (ref 13.0–18.0)
LYMPHS PCT: 15 %
Lymphocyte #: 1.1 10*3/uL (ref 1.0–3.6)
MCH: 29.8 pg (ref 26.0–34.0)
MCHC: 32.9 g/dL (ref 32.0–36.0)
MCV: 91 fL (ref 80–100)
Monocyte #: 0.6 x10 3/mm (ref 0.2–1.0)
Monocyte %: 9 %
Neutrophil #: 5 10*3/uL (ref 1.4–6.5)
Neutrophil %: 69.6 %
Platelet: 102 10*3/uL — ABNORMAL LOW (ref 150–440)
RBC: 3.29 10*6/uL — ABNORMAL LOW (ref 4.40–5.90)
RDW: 19 % — ABNORMAL HIGH (ref 11.5–14.5)
WBC: 7.2 10*3/uL (ref 3.8–10.6)

## 2014-01-27 LAB — BASIC METABOLIC PANEL
ANION GAP: 7 (ref 7–16)
BUN: 27 mg/dL — ABNORMAL HIGH (ref 7–18)
Calcium, Total: 8.5 mg/dL (ref 8.5–10.1)
Chloride: 102 mmol/L (ref 98–107)
Co2: 33 mmol/L — ABNORMAL HIGH (ref 21–32)
Creatinine: 1.85 mg/dL — ABNORMAL HIGH (ref 0.60–1.30)
EGFR (Non-African Amer.): 32 — ABNORMAL LOW
GFR CALC AF AMER: 37 — AB
Glucose: 91 mg/dL (ref 65–99)
Osmolality: 288 (ref 275–301)
Potassium: 3.4 mmol/L — ABNORMAL LOW (ref 3.5–5.1)
SODIUM: 142 mmol/L (ref 136–145)

## 2014-01-28 LAB — BASIC METABOLIC PANEL
ANION GAP: 7 (ref 7–16)
BUN: 23 mg/dL — ABNORMAL HIGH (ref 7–18)
CHLORIDE: 102 mmol/L (ref 98–107)
CO2: 32 mmol/L (ref 21–32)
Calcium, Total: 8.6 mg/dL (ref 8.5–10.1)
Creatinine: 1.86 mg/dL — ABNORMAL HIGH (ref 0.60–1.30)
EGFR (African American): 37 — ABNORMAL LOW
EGFR (Non-African Amer.): 32 — ABNORMAL LOW
GLUCOSE: 82 mg/dL (ref 65–99)
OSMOLALITY: 284 (ref 275–301)
Potassium: 3.3 mmol/L — ABNORMAL LOW (ref 3.5–5.1)
SODIUM: 141 mmol/L (ref 136–145)

## 2014-01-28 LAB — CBC WITH DIFFERENTIAL/PLATELET
BASOS PCT: 0.6 %
Basophil #: 0 10*3/uL (ref 0.0–0.1)
EOS ABS: 0.4 10*3/uL (ref 0.0–0.7)
Eosinophil %: 5.6 %
HCT: 29.3 % — ABNORMAL LOW (ref 40.0–52.0)
HGB: 9.4 g/dL — ABNORMAL LOW (ref 13.0–18.0)
Lymphocyte #: 1.3 10*3/uL (ref 1.0–3.6)
Lymphocyte %: 18.2 %
MCH: 29.4 pg (ref 26.0–34.0)
MCHC: 32.1 g/dL (ref 32.0–36.0)
MCV: 92 fL (ref 80–100)
MONO ABS: 0.7 x10 3/mm (ref 0.2–1.0)
MONOS PCT: 9.7 %
Neutrophil #: 4.7 10*3/uL (ref 1.4–6.5)
Neutrophil %: 65.9 %
PLATELETS: 101 10*3/uL — AB (ref 150–440)
RBC: 3.2 10*6/uL — ABNORMAL LOW (ref 4.40–5.90)
RDW: 19.5 % — ABNORMAL HIGH (ref 11.5–14.5)
WBC: 7.1 10*3/uL (ref 3.8–10.6)

## 2014-01-30 LAB — CULTURE, BLOOD (SINGLE)

## 2014-04-24 ENCOUNTER — Inpatient Hospital Stay: Payer: Self-pay | Admitting: Internal Medicine

## 2014-04-24 LAB — URINALYSIS, COMPLETE
Bacteria: NONE SEEN
Bilirubin,UR: NEGATIVE
GLUCOSE, UR: NEGATIVE mg/dL (ref 0–75)
Hyaline Cast: 9
Ketone: NEGATIVE
Nitrite: NEGATIVE
Ph: 6 (ref 4.5–8.0)
Protein: NEGATIVE
Specific Gravity: 1.009 (ref 1.003–1.030)
Squamous Epithelial: 3

## 2014-04-24 LAB — COMPREHENSIVE METABOLIC PANEL
ALBUMIN: 3.2 g/dL — AB (ref 3.4–5.0)
AST: 38 U/L — AB (ref 15–37)
Alkaline Phosphatase: 143 U/L — ABNORMAL HIGH
BUN: 19 mg/dL — AB (ref 7–18)
Bilirubin,Total: 0.7 mg/dL (ref 0.2–1.0)
CALCIUM: 8.7 mg/dL (ref 8.5–10.1)
Chloride: 107 mmol/L (ref 98–107)
Co2: 32 mmol/L (ref 21–32)
Creatinine: 1.44 mg/dL — ABNORMAL HIGH (ref 0.60–1.30)
EGFR (African American): 60 — ABNORMAL LOW
EGFR (Non-African Amer.): 49 — ABNORMAL LOW
Glucose: 111 mg/dL — ABNORMAL HIGH (ref 65–99)
OSMOLALITY: 256 (ref 275–301)
Potassium: 3.6 mmol/L (ref 3.5–5.1)
SGPT (ALT): 52 U/L
Sodium: 126 mmol/L — ABNORMAL LOW (ref 136–145)
Total Protein: 8 g/dL (ref 6.4–8.2)

## 2014-04-24 LAB — TROPONIN I
TROPONIN-I: 0.13 ng/mL — AB
Troponin-I: 0.14 ng/mL — ABNORMAL HIGH
Troponin-I: 0.13 ng/mL — ABNORMAL HIGH

## 2014-04-24 LAB — CK TOTAL AND CKMB (NOT AT ARMC)
CK, TOTAL: 53 U/L
CK, TOTAL: 48 U/L
CK, Total: 46 U/L
CK-MB: 1.6 ng/mL (ref 0.5–3.6)
CK-MB: 1.2 ng/mL (ref 0.5–3.6)
CK-MB: 1.3 ng/mL (ref 0.5–3.6)

## 2014-04-24 LAB — CBC
HCT: 35.3 % — AB (ref 40.0–52.0)
HGB: 10.8 g/dL — ABNORMAL LOW (ref 13.0–18.0)
MCH: 28.5 pg (ref 26.0–34.0)
MCHC: 30.7 g/dL — ABNORMAL LOW (ref 32.0–36.0)
MCV: 93 fL (ref 80–100)
Platelet: 117 10*3/uL — ABNORMAL LOW (ref 150–440)
RBC: 3.8 10*6/uL — ABNORMAL LOW (ref 4.40–5.90)
RDW: 19.4 % — ABNORMAL HIGH (ref 11.5–14.5)
WBC: 11 10*3/uL — AB (ref 3.8–10.6)

## 2014-04-24 LAB — PRO B NATRIURETIC PEPTIDE: B-TYPE NATIURETIC PEPTID: 15975 pg/mL — AB (ref 0–450)

## 2014-04-25 LAB — BASIC METABOLIC PANEL
ANION GAP: 6 — AB (ref 7–16)
BUN: 20 mg/dL — ABNORMAL HIGH (ref 7–18)
Calcium, Total: 8.7 mg/dL (ref 8.5–10.1)
Chloride: 98 mmol/L (ref 98–107)
Co2: 35 mmol/L — ABNORMAL HIGH (ref 21–32)
Creatinine: 1.35 mg/dL — ABNORMAL HIGH (ref 0.60–1.30)
EGFR (African American): >60
EGFR (Non-African Amer.): 53 — ABNORMAL LOW
Glucose: 98 mg/dL (ref 65–99)
Osmolality: 280 (ref 275–301)
POTASSIUM: 3.6 mmol/L (ref 3.5–5.1)
Sodium: 139 mmol/L (ref 136–145)

## 2014-04-26 LAB — BASIC METABOLIC PANEL
Anion Gap: 5 — ABNORMAL LOW (ref 7–16)
BUN: 23 mg/dL — ABNORMAL HIGH (ref 7–18)
CREATININE: 1.4 mg/dL — AB (ref 0.60–1.30)
Calcium, Total: 8.6 mg/dL (ref 8.5–10.1)
Chloride: 101 mmol/L (ref 98–107)
Co2: 36 mmol/L — ABNORMAL HIGH (ref 21–32)
EGFR (African American): >60
GFR CALC NON AF AMER: 51 — AB
Glucose: 85 mg/dL (ref 65–99)
OSMOLALITY: 286 (ref 275–301)
Potassium: 4 mmol/L (ref 3.5–5.1)
Sodium: 142 mmol/L (ref 136–145)

## 2014-04-27 ENCOUNTER — Encounter: Payer: Self-pay | Admitting: Internal Medicine

## 2014-05-02 LAB — BASIC METABOLIC PANEL
Anion Gap: 5 — ABNORMAL LOW (ref 7–16)
BUN: 24 mg/dL — ABNORMAL HIGH (ref 7–18)
CALCIUM: 8.6 mg/dL (ref 8.5–10.1)
CREATININE: 1.58 mg/dL — AB (ref 0.60–1.30)
Chloride: 101 mmol/L (ref 98–107)
Co2: 36 mmol/L — ABNORMAL HIGH (ref 21–32)
EGFR (African American): 54 — ABNORMAL LOW
GFR CALC NON AF AMER: 44 — AB
Glucose: 67 mg/dL (ref 65–99)
Osmolality: 285 (ref 275–301)
Potassium: 4.2 mmol/L (ref 3.5–5.1)
SODIUM: 142 mmol/L (ref 136–145)

## 2014-05-16 ENCOUNTER — Ambulatory Visit: Payer: Self-pay | Admitting: Family

## 2014-05-19 ENCOUNTER — Encounter: Payer: Self-pay | Admitting: Internal Medicine

## 2014-05-27 ENCOUNTER — Emergency Department: Payer: Self-pay | Admitting: Emergency Medicine

## 2014-05-27 LAB — CBC
HCT: 35.5 % — ABNORMAL LOW (ref 40.0–52.0)
HGB: 11.3 g/dL — ABNORMAL LOW (ref 13.0–18.0)
MCH: 29.5 pg (ref 26.0–34.0)
MCHC: 31.8 g/dL — ABNORMAL LOW (ref 32.0–36.0)
MCV: 93 fL (ref 80–100)
Platelet: 142 10*3/uL — ABNORMAL LOW (ref 150–440)
RBC: 3.83 10*6/uL — AB (ref 4.40–5.90)
RDW: 18.9 % — AB (ref 11.5–14.5)
WBC: 10 10*3/uL (ref 3.8–10.6)

## 2014-05-27 LAB — BASIC METABOLIC PANEL
Anion Gap: 5 — ABNORMAL LOW (ref 7–16)
BUN: 34 mg/dL — ABNORMAL HIGH (ref 7–18)
CO2: 35 mmol/L — AB (ref 21–32)
CREATININE: 1.85 mg/dL — AB (ref 0.60–1.30)
Calcium, Total: 9.3 mg/dL (ref 8.5–10.1)
Chloride: 101 mmol/L (ref 98–107)
GLUCOSE: 103 mg/dL — AB (ref 65–99)
Osmolality: 289 (ref 275–301)
Potassium: 4.2 mmol/L (ref 3.5–5.1)
Sodium: 141 mmol/L (ref 136–145)

## 2014-05-27 LAB — URINALYSIS, COMPLETE
BILIRUBIN, UR: NEGATIVE
BLOOD: NEGATIVE
Bacteria: NONE SEEN
Glucose,UR: NEGATIVE mg/dL (ref 0–75)
Hyaline Cast: 3
Ketone: NEGATIVE
NITRITE: NEGATIVE
Ph: 6 (ref 4.5–8.0)
Protein: NEGATIVE
Specific Gravity: 1.008 (ref 1.003–1.030)
Squamous Epithelial: 1
WBC UR: 1 /HPF (ref 0–5)

## 2014-05-27 LAB — TROPONIN I
Troponin-I: 0.17 ng/mL — ABNORMAL HIGH
Troponin-I: 0.18 ng/mL — ABNORMAL HIGH

## 2014-06-10 ENCOUNTER — Ambulatory Visit: Payer: Self-pay | Admitting: Specialist

## 2014-06-18 ENCOUNTER — Encounter: Payer: Self-pay | Admitting: Internal Medicine

## 2014-06-19 ENCOUNTER — Ambulatory Visit: Payer: Self-pay | Admitting: Family

## 2014-06-19 ENCOUNTER — Encounter: Payer: Self-pay | Admitting: Cardiovascular Disease

## 2014-06-19 DIAGNOSIS — R079 Chest pain, unspecified: Secondary | ICD-10-CM

## 2014-06-19 LAB — BASIC METABOLIC PANEL
Anion Gap: 6 — ABNORMAL LOW (ref 7–16)
BUN: 51 mg/dL — AB (ref 7–18)
CALCIUM: 9.5 mg/dL (ref 8.5–10.1)
CREATININE: 2.08 mg/dL — AB (ref 0.60–1.30)
Chloride: 106 mmol/L (ref 98–107)
Co2: 33 mmol/L — ABNORMAL HIGH (ref 21–32)
EGFR (Non-African Amer.): 32 — ABNORMAL LOW
GFR CALC AF AMER: 39 — AB
GLUCOSE: 112 mg/dL — AB (ref 65–99)
Osmolality: 303 (ref 275–301)
Potassium: 3.8 mmol/L (ref 3.5–5.1)
Sodium: 145 mmol/L (ref 136–145)

## 2014-06-19 LAB — CBC WITH DIFFERENTIAL/PLATELET
BASOS ABS: 0 10*3/uL (ref 0.0–0.1)
Basophil %: 0.3 %
EOS PCT: 1.4 %
Eosinophil #: 0.2 10*3/uL (ref 0.0–0.7)
HCT: 32.9 % — AB (ref 40.0–52.0)
HGB: 10.5 g/dL — ABNORMAL LOW (ref 13.0–18.0)
LYMPHS PCT: 12.3 %
Lymphocyte #: 1.5 10*3/uL (ref 1.0–3.6)
MCH: 29.7 pg (ref 26.0–34.0)
MCHC: 31.9 g/dL — AB (ref 32.0–36.0)
MCV: 93 fL (ref 80–100)
MONO ABS: 0.8 x10 3/mm (ref 0.2–1.0)
Monocyte %: 6.3 %
Neutrophil #: 9.8 10*3/uL — ABNORMAL HIGH (ref 1.4–6.5)
Neutrophil %: 79.7 %
Platelet: 133 10*3/uL — ABNORMAL LOW (ref 150–440)
RBC: 3.53 10*6/uL — AB (ref 4.40–5.90)
RDW: 18.9 % — ABNORMAL HIGH (ref 11.5–14.5)
WBC: 12.3 10*3/uL — AB (ref 3.8–10.6)

## 2014-06-19 LAB — TROPONIN I: Troponin-I: 0.23 ng/mL — ABNORMAL HIGH

## 2014-06-19 LAB — CK-MB: CK-MB: 1.3 ng/mL (ref 0.5–3.6)

## 2014-06-19 LAB — CK: CK, Total: 33 U/L — ABNORMAL LOW (ref 39–308)

## 2014-07-03 ENCOUNTER — Ambulatory Visit: Payer: Self-pay | Admitting: Family

## 2014-07-19 ENCOUNTER — Encounter: Payer: Self-pay | Admitting: Internal Medicine

## 2014-08-16 ENCOUNTER — Ambulatory Visit: Payer: Self-pay | Admitting: Cardiology

## 2014-09-19 ENCOUNTER — Ambulatory Visit: Payer: Self-pay | Admitting: Gastroenterology

## 2014-10-02 ENCOUNTER — Ambulatory Visit: Payer: Self-pay | Admitting: Family

## 2014-10-11 ENCOUNTER — Inpatient Hospital Stay: Payer: Self-pay | Admitting: Internal Medicine

## 2014-10-11 LAB — COMPREHENSIVE METABOLIC PANEL
Albumin: 3.2 g/dL — ABNORMAL LOW
Alkaline Phosphatase: 95 U/L
Anion Gap: 8 (ref 7–16)
BUN: 64 mg/dL — ABNORMAL HIGH
Bilirubin,Total: 0.4 mg/dL
Calcium, Total: 9.2 mg/dL
Chloride: 104 mmol/L
Co2: 29 mmol/L
Creatinine: 3.95 mg/dL — ABNORMAL HIGH
EGFR (African American): 15 — ABNORMAL LOW
EGFR (Non-African Amer.): 13 — ABNORMAL LOW
Glucose: 113 mg/dL — ABNORMAL HIGH
Potassium: 5.3 mmol/L — ABNORMAL HIGH
SGOT(AST): 32 U/L
SGPT (ALT): 27 U/L
Sodium: 141 mmol/L
Total Protein: 7.4 g/dL

## 2014-10-11 LAB — CBC
HCT: 32.7 % — ABNORMAL LOW (ref 40.0–52.0)
HGB: 10.3 g/dL — ABNORMAL LOW (ref 13.0–18.0)
MCH: 29.5 pg (ref 26.0–34.0)
MCHC: 31.4 g/dL — ABNORMAL LOW (ref 32.0–36.0)
MCV: 94 fL (ref 80–100)
Platelet: 100 10*3/uL — ABNORMAL LOW (ref 150–440)
RBC: 3.49 10*6/uL — ABNORMAL LOW (ref 4.40–5.90)
RDW: 17.2 % — ABNORMAL HIGH (ref 11.5–14.5)
WBC: 13 10*3/uL — ABNORMAL HIGH (ref 3.8–10.6)

## 2014-10-11 LAB — URINALYSIS, COMPLETE
BACTERIA: NONE SEEN
Bilirubin,UR: NEGATIVE
Blood: NEGATIVE
Glucose,UR: NEGATIVE mg/dL (ref 0–75)
Ketone: NEGATIVE
Nitrite: NEGATIVE
PH: 6 (ref 4.5–8.0)
Protein: NEGATIVE
Specific Gravity: 1.009 (ref 1.003–1.030)
Squamous Epithelial: 1

## 2014-10-12 LAB — BASIC METABOLIC PANEL
ANION GAP: 10 (ref 7–16)
BUN: 58 mg/dL — AB
CO2: 29 mmol/L
CREATININE: 3.72 mg/dL — AB
Calcium, Total: 9.1 mg/dL
Chloride: 104 mmol/L
EGFR (Non-African Amer.): 14 — ABNORMAL LOW
GFR CALC AF AMER: 16 — AB
GLUCOSE: 79 mg/dL
Potassium: 4.3 mmol/L
Sodium: 143 mmol/L

## 2014-10-14 LAB — BASIC METABOLIC PANEL
Anion Gap: 9 (ref 7–16)
BUN: 54 mg/dL — AB
CREATININE: 2.85 mg/dL — AB
Calcium, Total: 9 mg/dL
Chloride: 109 mmol/L
Co2: 28 mmol/L
EGFR (African American): 22 — ABNORMAL LOW
EGFR (Non-African Amer.): 19 — ABNORMAL LOW
Glucose: 127 mg/dL — ABNORMAL HIGH
Potassium: 4.6 mmol/L
SODIUM: 146 mmol/L — AB

## 2014-10-14 LAB — URINE CULTURE

## 2014-10-15 LAB — BASIC METABOLIC PANEL
Anion Gap: 8 (ref 7–16)
BUN: 55 mg/dL — ABNORMAL HIGH
CALCIUM: 9.4 mg/dL
CREATININE: 2.46 mg/dL — AB
Chloride: 110 mmol/L
Co2: 30 mmol/L
EGFR (African American): 26 — ABNORMAL LOW
GFR CALC NON AF AMER: 22 — AB
GLUCOSE: 116 mg/dL — AB
Potassium: 4.9 mmol/L
SODIUM: 148 mmol/L — AB

## 2014-10-18 ENCOUNTER — Ambulatory Visit
Admit: 2014-10-18 | Disposition: A | Discharge status: Home / Self Care | Payer: Self-pay | Attending: Internal Medicine | Admitting: Internal Medicine

## 2014-10-22 ENCOUNTER — Inpatient Hospital Stay
Admit: 2014-10-22 | Disposition: A | Discharge status: Home / Self Care | Payer: Self-pay | Attending: Internal Medicine | Admitting: Internal Medicine

## 2014-10-22 LAB — URINALYSIS, COMPLETE
BILIRUBIN, UR: NEGATIVE
GLUCOSE, UR: NEGATIVE mg/dL (ref 0–75)
Hyaline Cast: 6
KETONE: NEGATIVE
NITRITE: NEGATIVE
Ph: 5 (ref 4.5–8.0)
Protein: NEGATIVE
RBC,UR: 9 /HPF (ref 0–5)
SPECIFIC GRAVITY: 1.014 (ref 1.003–1.030)

## 2014-10-22 LAB — HEPATIC FUNCTION PANEL A (ARMC)
ALBUMIN: 3.3 g/dL — AB
Alkaline Phosphatase: 91 U/L
BILIRUBIN DIRECT: 0.1 mg/dL
Bilirubin,Total: 0.5 mg/dL
Indirect Bilirubin: 0.4
SGOT(AST): 37 U/L
SGPT (ALT): 57 U/L
TOTAL PROTEIN: 7.7 g/dL

## 2014-10-22 LAB — BASIC METABOLIC PANEL
Anion Gap: 11 (ref 7–16)
BUN: 73 mg/dL — ABNORMAL HIGH
CALCIUM: 9.8 mg/dL
CHLORIDE: 111 mmol/L
Co2: 33 mmol/L — ABNORMAL HIGH
Creatinine: 2.04 mg/dL — ABNORMAL HIGH
EGFR (African American): 33 — ABNORMAL LOW
EGFR (Non-African Amer.): 28 — ABNORMAL LOW
GLUCOSE: 114 mg/dL — AB
Potassium: 4.1 mmol/L
Sodium: 155 mmol/L — ABNORMAL HIGH

## 2014-10-22 LAB — APTT: ACTIVATED PTT: 25.9 s (ref 23.6–35.9)

## 2014-10-22 LAB — CK-MB
CK-MB: 6.8 ng/mL — ABNORMAL HIGH
CK-MB: 5.9 ng/mL — ABNORMAL HIGH

## 2014-10-22 LAB — PROTIME-INR
INR: 1.3
PROTHROMBIN TIME: 16.3 s — AB

## 2014-10-22 LAB — CBC
HCT: 39.2 % — ABNORMAL LOW (ref 40.0–52.0)
HGB: 12.2 g/dL — AB (ref 13.0–18.0)
MCH: 29.7 pg (ref 26.0–34.0)
MCHC: 31.2 g/dL — AB (ref 32.0–36.0)
MCV: 95 fL (ref 80–100)
Platelet: 128 10*3/uL — ABNORMAL LOW (ref 150–440)
RBC: 4.12 10*6/uL — ABNORMAL LOW (ref 4.40–5.90)
RDW: 18.3 % — ABNORMAL HIGH (ref 11.5–14.5)
WBC: 18.8 10*3/uL — ABNORMAL HIGH (ref 3.8–10.6)

## 2014-10-22 LAB — TROPONIN I
Troponin-I: 2.69 ng/mL — ABNORMAL HIGH
Troponin-I: 2.57 ng/mL — ABNORMAL HIGH

## 2014-10-22 LAB — LACTIC ACID, PLASMA: Lactic Acid, Venous: 1.9 mmol/L

## 2014-10-23 LAB — BASIC METABOLIC PANEL
ANION GAP: 8 (ref 7–16)
BUN: 60 mg/dL — ABNORMAL HIGH
CHLORIDE: 115 mmol/L — AB
CREATININE: 1.69 mg/dL — AB
Calcium, Total: 8.9 mg/dL
Co2: 29 mmol/L
EGFR (Non-African Amer.): 35 — ABNORMAL LOW
GFR CALC AF AMER: 41 — AB
Glucose: 109 mg/dL — ABNORMAL HIGH
Potassium: 3.5 mmol/L
Sodium: 152 mmol/L — ABNORMAL HIGH

## 2014-10-23 LAB — CK-MB: CK-MB: 6.8 ng/mL — AB

## 2014-10-23 LAB — TROPONIN I: Troponin-I: 2.66 ng/mL — ABNORMAL HIGH

## 2014-10-24 LAB — COMPREHENSIVE METABOLIC PANEL
ALBUMIN: 2.3 g/dL — AB
AST: 22 U/L
Alkaline Phosphatase: 69 U/L
Anion Gap: 8 (ref 7–16)
BUN: 46 mg/dL — ABNORMAL HIGH
Bilirubin,Total: 0.4 mg/dL
CALCIUM: 8.3 mg/dL — AB
CHLORIDE: 110 mmol/L
CO2: 28 mmol/L
Creatinine: 1.59 mg/dL — ABNORMAL HIGH
EGFR (Non-African Amer.): 38 — ABNORMAL LOW
GFR CALC AF AMER: 44 — AB
Glucose: 104 mg/dL — ABNORMAL HIGH
Potassium: 3.4 mmol/L — ABNORMAL LOW
SGPT (ALT): 28 U/L
Sodium: 146 mmol/L — ABNORMAL HIGH
Total Protein: 5.8 g/dL — ABNORMAL LOW

## 2014-10-24 LAB — CBC WITH DIFFERENTIAL/PLATELET
BASOS ABS: 0 10*3/uL (ref 0.0–0.1)
Basophil %: 0.1 %
Eosinophil #: 0.1 10*3/uL (ref 0.0–0.7)
Eosinophil %: 1 %
HCT: 31.4 % — ABNORMAL LOW (ref 40.0–52.0)
HGB: 9.9 g/dL — ABNORMAL LOW (ref 13.0–18.0)
LYMPHS ABS: 0.9 10*3/uL — AB (ref 1.0–3.6)
Lymphocyte %: 8.5 %
MCH: 30.2 pg (ref 26.0–34.0)
MCHC: 31.6 g/dL — AB (ref 32.0–36.0)
MCV: 96 fL (ref 80–100)
MONO ABS: 0.8 x10 3/mm (ref 0.2–1.0)
Monocyte %: 7.5 %
Neutrophil #: 8.8 10*3/uL — ABNORMAL HIGH (ref 1.4–6.5)
Neutrophil %: 82.9 %
PLATELETS: 89 10*3/uL — AB (ref 150–440)
RBC: 3.29 10*6/uL — ABNORMAL LOW (ref 4.40–5.90)
RDW: 18.5 % — AB (ref 11.5–14.5)
WBC: 10.7 10*3/uL — ABNORMAL HIGH (ref 3.8–10.6)

## 2014-10-24 LAB — MAGNESIUM: MAGNESIUM: 1.7 mg/dL

## 2014-10-25 LAB — BASIC METABOLIC PANEL
Anion Gap: 6 — ABNORMAL LOW (ref 7–16)
BUN: 39 mg/dL — ABNORMAL HIGH
Calcium, Total: 8.1 mg/dL — ABNORMAL LOW
Chloride: 107 mmol/L
Co2: 29 mmol/L
Creatinine: 1.52 mg/dL — ABNORMAL HIGH
EGFR (Non-African Amer.): 40 — ABNORMAL LOW
GFR CALC AF AMER: 46 — AB
GLUCOSE: 144 mg/dL — AB
POTASSIUM: 3.6 mmol/L
SODIUM: 142 mmol/L

## 2014-10-25 LAB — URINE CULTURE

## 2014-10-26 LAB — CBC WITH DIFFERENTIAL/PLATELET
BASOS ABS: 0 10*3/uL (ref 0.0–0.1)
Basophil %: 0.2 %
Eosinophil #: 0.2 10*3/uL (ref 0.0–0.7)
Eosinophil %: 1.9 %
HCT: 31.3 % — AB (ref 40.0–52.0)
HGB: 10 g/dL — AB (ref 13.0–18.0)
Lymphocyte #: 0.9 10*3/uL — ABNORMAL LOW (ref 1.0–3.6)
Lymphocyte %: 9.9 %
MCH: 30.2 pg (ref 26.0–34.0)
MCHC: 31.9 g/dL — AB (ref 32.0–36.0)
MCV: 95 fL (ref 80–100)
MONOS PCT: 7.6 %
Monocyte #: 0.7 x10 3/mm (ref 0.2–1.0)
NEUTROS ABS: 7.1 10*3/uL — AB (ref 1.4–6.5)
NEUTROS PCT: 80.4 %
Platelet: 93 10*3/uL — ABNORMAL LOW (ref 150–440)
RBC: 3.3 10*6/uL — ABNORMAL LOW (ref 4.40–5.90)
RDW: 18.4 % — AB (ref 11.5–14.5)
WBC: 8.9 10*3/uL (ref 3.8–10.6)

## 2014-10-26 LAB — BASIC METABOLIC PANEL WITH GFR
Anion Gap: 5 — ABNORMAL LOW
BUN: 38 mg/dL — ABNORMAL HIGH
Calcium, Total: 7.8 mg/dL — ABNORMAL LOW
Chloride: 109 mmol/L
Co2: 28 mmol/L
Creatinine: 1.52 mg/dL — ABNORMAL HIGH
EGFR (African American): 46 — ABNORMAL LOW
EGFR (Non-African Amer.): 40 — ABNORMAL LOW
Glucose: 112 mg/dL — ABNORMAL HIGH
Potassium: 4 mmol/L
Sodium: 142 mmol/L

## 2014-10-27 LAB — CULTURE, BLOOD (SINGLE)

## 2014-11-01 ENCOUNTER — Other Ambulatory Visit: Payer: Self-pay | Admitting: Specialist

## 2014-11-01 DIAGNOSIS — R911 Solitary pulmonary nodule: Secondary | ICD-10-CM

## 2014-11-05 NOTE — Discharge Summary (Signed)
PATIENT NAME:  Mathew Morris, Mathew Morris MR#:  [id number] DATE OF BIRTH:  04/12/1924  DATE OF ADMISSION:  06/26/2012 DATE OF DISCHARGE:  06/28/2012  CONSULTANT: Dr. Kathyrn Sheriff.  PRIMARY CARE PHYSICIAN: Dr. Kary Kos.  CHIEF COMPLAINT: Sore throat, dysphagia.   DISCHARGE DIAGNOSES:  1. Epiglottitis.  2. Chronic kidney disease.  3. Hypothyroidism.  4. Gout.  5. Benign prostatic hypertrophy.  6. Anemia.  7. History of non-small cell cancer status post surgery about 10 years ago.  8. Osteoarthritis.  9. Obesity.  10. Obstructive sleep apnea on continuous positive airway pressure.   DISCHARGE MEDICATIONS:  1. Protonix 40 mg daily.  2. Allopurinol 250 mg daily.  3. Isosorbide mononitrate 60 mg extended-release daily.  4. Metoprolol 75 mg b.i.d.  5. Atorvastatin 10 mg daily.  6. Plavix 75 mg daily.  7. Acetaminophen/hydrocodone 325/5 mg 1 tab every six hours as needed for pain.  8. B complex 50, 1 tab daily.  9. Furosemide 80 mg 2 times a day.  10. Colchicine 0.6 mg daily. 11. Ferrous sulfate 325 mg 1 tab 2 times a day.  12. Terazosin 10 mg daily at bedtime.  13. Losartan 50 mg daily.  14. Finasteride 5 mg daily.  15. Aspirin 325 mg daily.  16. Nitrostat 0.4 mg hourly as needed for chest pain.  17. Prednisone 5 mg 1 tab once a day as needed.  18. Tylenol arthritis 650 mg extended-release 2 tabs once a day at bedtime as needed for pain. 19. Simethicone 125 mg one cap as needed daily.  20. Systane preserve free ophthalmic solution drop to each affected eye as needed. 21. Cefdinir 300 mg one cap every 12 hours for eight days.   DIET: Low sodium.   ACTIVITY: As tolerated.   FOLLOW UP: Please follow up with ENT, Dr. Kathyrn Sheriff, within a week.   DISPOSITION: Home.   HISTORY OF PRESENT ILLNESS/HOSPITAL COURSE: For full details of the history and physical, please see the dictation on 06/26/2012 by Dr. Bridgett Larsson, but briefly this is a pleasant 81 year old African American male who was sent in by Dr.  Kathyrn Sheriff from ENT for sore throat, dysphagia, which started day of admission. Patient was seen at ENT office and has been noted to have epiglottitis and was asked to have a direct admission to internal medicine. Patient was seen by Dr. Kathyrn Sheriff here. He was started on ceftriaxone. He was initially made n.p.o. and slowly the diet was advanced. Patient has significant improved pain. He did not have any significant deterioration nor evidence for stridor. He is not short of breath and he is currently tolerating his pills and diet. He was also seen by a swallow specialist. At this point, he will be discharged with antibiotics for an additional eight days and was instructed to follow with ENT within a week. He was also told that if he has any swelling issues, sore throat, shortness of breath, fevers please call your ENT physician or PCP right away.   CODE STATUS: Patient is FULL CODE.   TOTAL TIME SPENT: 35 minutes.  ____________________________ Vivien Presto, MD sa:cms D: 06/28/2012 12:04:21 ET T: 06/28/2012 12:18:18 ET  JOB#: [id number] cc: Huey Romans, MD Irven Easterly. Kary Kos, MD Vivien Presto MD ELECTRONICALLY SIGNED 07/18/2012 14:08

## 2014-11-05 NOTE — H&P (Signed)
PATIENT NAME:  Mathew Morris, NAVARRETE MR#:  [id number] DATE OF BIRTH:  April 03, 1924  DATE OF ADMISSION:  06/26/2012  PRIMARY CARE PHYSICIAN: Irven Easterly. Kary Kos, MD   REFERRING PHYSICIAN:   Huey Romans, MD, ENT physician   CHIEF COMPLAINT: Sore throat and dysphagia today.   HISTORY OF PRESENT ILLNESS: The patient is an 81 year old African American male with a history of right carotid arterial stenosis, status post stent placement recently, membranous glomerulonephritis, was sent by ENT physician, Dr. Kathyrn Sheriff, due to sore throat, dysphagia today. The patient had a cold several weeks ago, was treated with antibiotics and became better; however, the patient developed a sore throat and dysphagia today. It is very painful, and he  cannot swallow due to the pain. So, he went to the ENT physician's office and was noted to have epiglottitis Dr. Kathyrn Sheriff;  so he was sent to the hospital by Dr. Kathyrn Sheriff for direct admission. The patient denies any fever or chills, no headache or dizziness, no stridor, shortness of breath, wheezing. No cough, sputum, shortness of breath. The patient denies any other symptoms. 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    PAST MEDICAL HISTORY:  1. Chronic kidney disease.  2. Hypothyroidism.  3. Gout.  4. Benign prostatic hypertrophy.  5. Anemia.  6. History of non-small cell cancer, status post surgery about 10 years ago.  7. Osteoarthritis.  8. Obesity.  9. Sleep apnea.   PAST SURGICAL HISTORY:  1. Carotid stenosis, status post stent placement.  2. Status post left lower lobe surgical excision of lung cancer.   SOCIAL HISTORY: No smoking or drinking or illicit drugs.  FAMILY HISTORY: Father died of stomach cancer, and brother had a myocardial infarction.    ALLERGIES: No.   HOME MEDICATIONS:  1. Tylenol arthritis caplet 650 mg p.o., 2 tablets once a day at bedtime p.r.n.  2. Terazosin 10 mg p.o. once a day.  3. Protonix 40 mg p.o. daily.  4. Prednisone  5 mg p.o. once a day p.r.n.  5. Plavix 75 mg p.o. daily.  6. Nitrostat 0.4 mg p.o. p.r.n. for chest pain.  7. Lopressor 50 mg tablets, 1.5 tablets twice a day.  8. Losartan 50 mg p.o. at bedtime.  9. Isosorbide mononitrate 1 tablet once a day in the morning.  10. Lasix 80 mg p.o. b.i.d.  11. Finasteride 5 mg p.o. daily at bedtime.  12. Ferrous sulfate 325 mg p.o. b.i.d.  13. Dexilant 60 mg p.o. once a day.  14. Colcrys 0.6 mg p.o. once a day.  15. B complex  50, one p.o. once daily.  16. Atorvastatin 10 mg once daily.  17. Aspirin 325 mg p.o. daily.  18. Allopurinol 100 mg p.o. tablets, 2.5 tablets once a day.  19. Prevacid 1 tablet every six hours p.r.n.   REVIEW OF SYSTEMS: CONSTITUTIONAL: The patient denies any fever or chills. No headache or dizziness. No weakness, weight loss. EYES: No double vision, blurred vision. ENT: The patient has a sore throat and dysphagia but no epistaxis, slurred speech, or postnasal drip. CARDIOVASCULAR: No chest pain, palpitation, orthopnea, or nocturnal dyspnea. PULMONARY: No cough, sputum, shortness of breath, or hemoptysis. GASTROINTESTINAL: No abdominal pain, nausea, vomiting, or diarrhea. GENITOURINARY: No dysuria, hematuria, or incontinence. SKIN: No rash or jaundice. NEUROLOGIC: No syncope, loss of consciousness or seizure. HEMATOLOGY: No easy bruising, bleeding.   PHYSICAL EXAMINATION:  VITAL SIGNS: Temperature 98, blood pressure 157/79, pulse 75, oxygen saturation 95% on room air.  GENERAL: The patient is alert, awake, oriented, in no acute distress.   HEENT: Pupils are round, equal, reactive to light and accommodation. Moist oral mucosa. Clear oropharynx.  No erythema.  NECK: Supple. No JVD or carotid bruit. No lymphadenopathy. No thyromegaly.    CARDIOVASCULAR: S1, S2 regular rate and rhythm. No murmurs, gallops.   PULMONARY: Bilateral air entry. No wheezing or rales. No use of accessory muscles to breathe.   ABDOMEN: Soft. No distention  or tenderness. No organomegaly. Bowel sounds present.   EXTREMITIES: No edema, clubbing, or cyanosis. No calf tenderness. Strong bilateral pedal pulses.   SKIN: No rash or jaundice.   NEUROLOGIC: Alert and oriented x3. No focal deficit. Power five out of five. Sensory intact.   LABORATORY DATA: None.   IMPRESSION:  1. Acute epiglottitis.  2. History of chronic kidney disease.  3. Anemia.  4. Hypothyroidism.  5. Benign prostatic hypertrophy.  6. Hypertension.  7. Obesity.  8. Sleep apnea.  9. History of non small cell lung cancer.   PLAN OF TREATMENT:  1. The patient will be given Rocephin IVPB and given [employer] Mouthwash per Dr. Kathyrn Sheriff.  2. We will get a CBC and BMP.  3. Follow up with Dr. Kathyrn Sheriff.  He will see the patient late today.  4. I will continue other home medications.  5. GI and deep vein thrombosis prophylaxis.   I discussed the patient's situation and the plan of treatment with the patient and the patient's wife.   TIME SPENT: About 53 minutes.  ____________________________ Demetrios Loll, MD qc:cbb D: 06/26/2012 18:45:03 ET T: 06/27/2012 06:25:10 ET JOB#: [id number]  cc: Demetrios Loll, MD, <Dictator> Irven Easterly. Kary Kos, MD Demetrios Loll MD ELECTRONICALLY SIGNED 06/27/2012 14:36

## 2014-11-09 NOTE — Consult Note (Signed)
PATIENT NAME:  Mathew Morris, Mathew Morris MR#:  [id number] DATE OF BIRTH:  May 11, 1924  DATE OF CONSULTATION:  04/24/2014  REFERRING PHYSICIAN:   CONSULTING PHYSICIAN:  Corey Skains, MD  CONSULTING PHYSICIAN:  Dr. Volanda Napoleon.    REASON FOR CONSULTATION: Acute on chronic systolic dysfunction, congestive heart failure, chronic kidney disease, elevated troponin, sleep apnea, coronary artery disease with anemia, and abnormal EKG.   CHIEF COMPLAINT:   Has shortness of breath.    HISTORY OF PRESENT ILLNESS: This is an 81 year old male with known chronic systolic dysfunction, congestive heart failure with ejection fraction of 30%, chronic kidney disease stage III, who has had significant progression of shortness of breath with physical activity as well as at rest within the last 2-3 days with some lower extremity edema. He has apparently felt like he was hypoxic. There was a mild amount of chest pressure with this during that period of time. He was seen in the Emergency Room with a BNP of 15,975, a chest x-ray with mild congestive heart failure and pulmonary edema, hemoglobin of 10.8, a troponin of 0.14, and a creatinine of 1.4. The patient therefore has had acute on chronic systolic dysfunction heart failure and will require further intervention, but he is slightly improved at this time with oxygen and intravenous Lasix. The patient has had no evidence of worsening symptoms in the last hour.   REVIEW OF SYSTEMS: Negative for vision change, ringing in the ears, hearing loss, cough, nausea, vomiting, diarrhea, bloody stools, stomach pain, extremity pain, leg weakness, cramping of the buttocks, known blood clots, headaches, blackouts, dizzy spells, nosebleed, congestion, trouble swallowing, frequent urination, urination at night, muscle weakness, numbness, anxiety, depression, skin lesions, or skin rashes.   PAST MEDICAL HISTORY:  1. Chronic systolic dysfunction congestive heart failure.   2. Stage III chronic kidney  disease.  3. Chronic anemia.  4. Coronary artery disease.   5. Obstructive sleep apnea.  6. Peripheral vascular disease with right carotid stent.   FAMILY HISTORY: Family members with hypertension and hyperlipidemia with coronary artery disease.   SOCIAL HISTORY: The patient currently denies alcohol or tobacco use.   ALLERGIES: AS LISTED.   MEDICATIONS: As listed.   PHYSICAL EXAMINATION:  VITAL SIGNS: Blood pressure is 126/68 bilaterally. Heart rate 72 upright, reclining, and slightly irregular.  GENERAL: He is a well-appearing elderly male in no acute distress.  HEAD, EYES, EARS, NOSE, AND THROAT: No icterus, thyromegaly, ulcers, hemorrhage, or xanthelasma.  CARDIOVASCULAR: Regular rate and rhythm. Normal S1, soft S2 with a 2 out of 6 to 3 out of 6 apical murmur consistent with mitral regurgitation with sort of RV heave and inferiorly displaced and enlarged PMI. Carotid upstroke normal without bruit. Jugular venous pressure is normal.  LUNGS: Have bibasilar crackles with normal respirations.  ABDOMEN: Soft, nontender, without hepatosplenomegaly or masses. Abdominal aorta is normal size without bruit.  EXTREMITIES: 2 + radial, femoral, dorsal pedal pulses, with 1 + lower extremity edema. No cyanosis, clubbing, or ulcers.  NEUROLOGIC: He is oriented to time, place, and person, with normal mood and affect.   ASSESSMENT: An 81 year old male with acute on chronic systolic dysfunction congestive heart failure, stage III chronic kidney disease, elevated troponin consistent with demand ischemia rather than acute coronary syndrome, anemia, coronary artery disease without evidence of acute myocardial infarction, obstructive sleep apnea, and peripheral vascular disease with abnormal EKG showing right normal sinus rhythm with first degree AV block, right axis deviation, and right bundle branch block, unchanged from before, needing further treatment.  RECOMMENDATIONS:  1.  Gentle diuresis for  pulmonary edema and acute on chronic systolic dysfunction congestive heart failure, watching for worsening chronic kidney disease.   2.  Continue to follow anemia and no intervention with heparin due to concerns of bleeding complications.  3.  No intervention of elevated troponin, most consistent with demand ischemia rather than acute myocardial infarction.  4.  Obstructive sleep apnea treatment with CPAP machine.   5.  Aspirin for further peripheral vascular disease risk reduction at 81 mg.  6.  Oxygenation for hypoxia.   7.  Beta blocker, ACE inhibitor, nitrates for further risk reduction in current congestive heart failure symptoms.  8.  Further consideration of intervention if necessary after medical management and ambulation.    ____________________________ Corey Skains, MD bjk:bu D: 04/24/2014 18:27:03 ET T: 04/24/2014 19:14:50 ET JOB#: [id number]  cc: Corey Skains, MD, <Dictator> Corey Skains MD ELECTRONICALLY SIGNED 04/25/2014 6:53

## 2014-11-09 NOTE — H&P (Signed)
PATIENT NAME:  Mathew Morris, Mathew Morris MR#:  [id number] DATE OF BIRTH:  15-Aug-1923  DATE OF ADMISSION:  04/24/2014  PRIMARY CARE PHYSICIAN: Irven Easterly. Kary Kos, MD  CHIEF COMPLAINT: Difficulty breathing, leg swelling for the last several days.   HISTORY OF PRESENT ILLNESS: Mathew Morris is a pleasant 81 year old African American gentleman with past medical history of ischemic cardiomyopathy with EF of 30% by previous echocardiogram, CHF systolic dysfunction, CAD noted by cardiac catheterization January 2015, CKD stage 3 with baseline creatinine around 1.8, hypothyroidism comes to the Emergency Room with increasing shortness of breath, leg swelling and weight gain of about 5 pounds. The patient says his weight varies a lot, however, he noticed his legs are getting tight, swollen and he is having trouble walking around without getting short of breath. He was found to be in congestive heart failure with elevated BNP, abnormal chest x-ray and increasing edema. He received IV Lasix in the Emergency Room. He is being admitted for further evaluation and management.   CODE STATUS: Discussed, the patient wants to be a full code.   PAST MEDICAL HISTORY: As listed: 1.  Ischemic cardiomyopathy.  2.  Coronary artery disease, status post cardiac catheterization in January 2015 showing 100% RCA blockage with collaterals, medical management recommended, and a 70% proximal RCA blockage with 50% proximal LAD.  3.  Congestive heart failure, both systolic/diastolic, with ejection fraction of 30% on echocardiogram.  4.  Obstructive sleep apnea, on CPAP.  5.  BPH. 6.  Hypothyroidism.  7.  Anemia of chronic disease.  8.  CKD stage 3/4 with a creatinine baseline around 1.866. 9.  Peripheral vascular disease.  10.  History of small cell lung cancer, status post lobectomy.  11.  History of dysphagia.  12.  GERD.  13.  Pulmonary hypertension.  14.  Osteoarthritis.  15.  History of vocal cord abnormalities. The patient does have a voice  recorder with which he talks.   ALLERGIES: No known drug allergies.   MEDICATIONS: 1.  Acetaminophen/hydrocodone 325/5 one tablet every 6 hours as needed.  2.  Allopurinol 100 mg b.i.d.  3.  Aspirin 325 p.o. daily at bedtime.  4.  Atorvastatin 10 mg p.o. daily at bedtime.  5.  Carvedilol 6.25 b.i.d.  6.  Plavix 75 mg daily at bedtime.  7.  Colchicine 0.6 mg as needed for gout flare up.  8.  Lansoprazole 60 mg p.o. bedtime.  9.  Ferrous sulfate 325 p.o. b.i.d.  10.  Finasteride 5 mg at bedtime.  11.  Lasix 80 mg 1-1/2 tablets daily.  12.  Imdur 120 mg extended release p.o. daily as needed.  13.  Nitroglycerin 0.4 mg sublingual as needed.  14.  K-Dur 10 mEq p.o. daily.  15.  Prednisone 10 mg daily as needed for swelling.   PAST SURGICAL HISTORY: 1.  Cardiac catheterization.  2.  Carpal tunnel surgery.  3.  Partial thyroidectomy.  4.  Left lower lobe resection for lung cancer.  5.  Carotid stenosis status post stent in the right carotid artery.   SOCIAL HISTORY: Lives at home with wife. Ambulates using a cane. Gets short of breath easily. Used to smoke but quit several years ago. No alcohol use.   FAMILY HISTORY: Significant for hypertension and heart disease in family.   REVIEW OF SYSTEMS: CONSTITUTIONAL: No fever. Positive for fatigue and weakness.  EYES: No blurred or double vision, glaucoma or cataracts.  EARS, NOSE AND THROAT: No tinnitus, ear pain, hearing loss.  RESPIRATORY: Positive for  shortness of breath and no cough or hemoptysis.  CARDIOVASCULAR: No chest pain. Positive for hypertension. No arrhythmia. Positive for increasing shortness of breath and leg swelling.  GASTROINTESTINAL: No nausea, vomiting, diarrhea, abdominal pain. No melena.  GENITOURINARY: No dysuria, hematuria, frequency.  ENDOCRINE: No polyuria, nocturia or thyroid problems.  HEMATOLOGY: Positive for anemia of chronic disease. No bleeding disorder.  MUSCULOSKELETAL: Positive for arthritis. Positive  for back pain. No swelling or gout.  NEUROLOGIC: No CVA, TIA, dysarthria or dysphagia.  PSYCHIATRIC: No anxiety, depression.  All other systems reviewed and negative.   PHYSICAL EXAMINATION:  GENERAL: The patient is awake, alert, oriented x 3, not in acute distress. VITAL SIGNS: Afebrile, pulse is 69 and regular, blood pressure is 127/70, saturations are 93% to 94% on room air.  HEENT: Atraumatic, normocephalic. PERRLA, EOM intact. Oral mucosa is moist.  NECK: Supple. No JVD. No carotid bruits.  RESPIRATORY: Decreased breath sounds in the bases. No audible rales, rhonchi or respiratory distress noted.  CARDIOVASCULAR: Both the heart sounds are normal. Rate, rhythm regular. PMI not lateralized. Chest nontender.  EXTREMITIES: Feeble pedal pulses. There is pitting edema, 3+ in both lower extremities up to the knee joint.  SKIN: Warm and dry.  PSYCHIATRIC: The patient is awake, alert, oriented x 3.   IMAGING AND LABORATORY DATA: Chest x-ray consistent with congestive heart failure.   UA negative for UTI. B-type natriuretic peptide is 15,000. First set of cardiac enzymes negative. White count is 11.0, creatinine is 1.4, sodium 126. H and H is 10.8 and 35.3, platelet count is 117,000.   EKG shows bifascicular block. Anteroseptal infarct appears old.   ASSESSMENT: [age]-year-old Mathew Morris with multiple medical problems, comes in with increasing shortness of breath, leg edema. He is being admitted with:  1.  Acute on chronic congestive heart failure. The patient's saturations were normal initially, however, he desaturation into upper 80s while talking. He is going to be admitted. His chest x-ray is consistent with congestive heart failure along with increasing leg edema. We will admit him to telemetry floor. The patient does not appear to be in severe respiratory distress. He will get Lasix 40 mg IV 3 times a day, monitor I's and O's, daily weights and creatinine closely. Cardiology has been  consulted.  2.  Chronic kidney disease stage 3/4. The patient's creatinine appears stable at this time.  3.  Hyponatremia. Suspected due to volume overload with congestive heart failure. We will monitor sodium with IV diuretics.  4.  Anemia of chronic disease, appears stable.  5.  Deep venous thrombosis prophylaxis. The patient will be on heparin subcutaneous.  6.  Hypertension. We will continue carvedilol and Imdur.  7.  History of coronary artery disease, on aspirin and Plavix.  8.  Hyperlipidemia, on atorvastatin.   CODE STATUS: Discussed with the patient, wants to be a full code.   NOTE: We will have [facility]F Clinic consult also done.  Above was discussed with the patient, the patient's wife who was present in the Emergency Room.   TIME SPENT: Fifty-five minutes.   ____________________________ Carl Best, MD mge:TT D: 04/24/2014 14:41:51 ET T: 04/24/2014 15:38:19 ET JOB#: [id number]  cc: Carl Best, MD, <Dictator> Javier Docker. Ubaldo Glassing, MD Irven Easterly. Kary Kos, MD

## 2014-11-09 NOTE — H&P (Signed)
PATIENT NAME:  Mathew Morris, Mathew Morris MR#:  [id number] DATE OF BIRTH:  [birth year]  DATE OF ADMISSION:  04/24/2014  PRIMARY CARE PHYSICIAN: Mathew Morris. Mathew Kos, MD  CHIEF COMPLAINT: Difficulty breathing, leg swelling for the last several days.   HISTORY OF PRESENT ILLNESS: Mathew Morris is a pleasant 81 year old African American gentleman with past medical history of ischemic cardiomyopathy with EF of 30% by previous echocardiogram, CHF systolic dysfunction, CAD noted by cardiac catheterization January 2015, CKD stage 3 with baseline creatinine around 1.8, hypothyroidism comes to the Emergency Room with increasing shortness of breath, leg swelling and weight gain of about 5 pounds. The patient says his weight varies a lot, however, he noticed his legs are getting tight, swollen and he is having trouble walking around without getting short of breath. He was found to be in congestive heart failure with elevated BNP, abnormal chest x-ray and increasing edema. He received IV Lasix in the Emergency Room. He is being admitted for further evaluation and management.   CODE STATUS: Discussed, the patient wants to be a full code.   PAST MEDICAL HISTORY: As listed: 1. Ischemic cardiomyopathy.  2. Coronary artery disease, status post cardiac catheterization in January 2015 showing 100% RCA blockage with collaterals, medical management recommended, and a 70% proximal RCA blockage with 50% proximal LAD.  3. Congestive heart failure, both systolic/diastolic, with ejection fraction of 30% on echocardiogram.  4. Obstructive sleep apnea, on CPAP.  5. BPH. 6. Hypothyroidism.  7. Anemia of chronic disease.  8. CKD stage 3/4 with a creatinine baseline around 1.866. 9. Peripheral vascular disease.  10. History of small cell lung cancer, status post lobectomy.  11. History of dysphagia.  12. GERD.  13. Pulmonary hypertension.  14. Osteoarthritis.  15. History of vocal cord abnormalities. The patient does have a voice recorder with  which he talks.   ALLERGIES: No known drug allergies.   MEDICATIONS: 1. Acetaminophen/hydrocodone 325/5 one tablet every 6 hours as needed.  2. Allopurinol 100 mg b.i.d.  3. Aspirin 325 p.o. daily at bedtime.  4. Atorvastatin 10 mg p.o. daily at bedtime.  5. Carvedilol 6.25 b.i.d.  6. Plavix 75 mg daily at bedtime.  7. Colchicine 0.6 mg as needed for gout flare up.  8. Lansoprazole 60 mg p.o. bedtime.  9. Ferrous sulfate 325 p.o. b.i.d.  10. Finasteride 5 mg at bedtime.  11. Lasix 80 mg 1-1/2 tablets daily.  12. Imdur 120 mg extended release p.o. daily as needed.  13. Nitroglycerin 0.4 mg sublingual as needed.  14. K-Dur 10 mEq p.o. daily.  15. Prednisone 10 mg daily as needed for swelling.   PAST SURGICAL HISTORY: 1. Cardiac catheterization.  2. Carpal tunnel surgery.  3. Partial thyroidectomy.  4. Left lower lobe resection for lung cancer.  5. Carotid stenosis status post stent in the right carotid artery.   SOCIAL HISTORY: Lives at home with wife. Ambulates using a cane. Gets short of breath easily. Used to smoke but quit several years ago. No alcohol use.   FAMILY HISTORY: Significant for hypertension and heart disease in family.   REVIEW OF SYSTEMS: CONSTITUTIONAL: No fever. Positive for fatigue and weakness.  EYES: No blurred or double vision, glaucoma or cataracts.  EARS, NOSE AND THROAT: No tinnitus, ear pain, hearing loss.  RESPIRATORY: Positive for shortness of breath and no cough or hemoptysis.  CARDIOVASCULAR: No chest pain. Positive for hypertension. No arrhythmia. Positive for increasing shortness of breath and leg swelling.  GASTROINTESTINAL: No nausea, vomiting, diarrhea, abdominal pain.  No melena.  GENITOURINARY: No dysuria, hematuria, frequency.  ENDOCRINE: No polyuria, nocturia or thyroid problems.  HEMATOLOGY: Positive for anemia of chronic disease. No bleeding disorder.  MUSCULOSKELETAL: Positive for arthritis. Positive for back pain. No swelling or  gout.  NEUROLOGIC: No CVA, TIA, dysarthria or dysphagia.  PSYCHIATRIC: No anxiety, depression.  All other systems reviewed and negative.   PHYSICAL EXAMINATION:  GENERAL: The patient is awake, alert, oriented x 3, not in acute distress. VITAL SIGNS: Afebrile, pulse is 69 and regular, blood pressure is 127/70, saturations are 93% to 94% on room air.  HEENT: Atraumatic, normocephalic. PERRLA, EOM intact. Oral mucosa is moist.  NECK: Supple. No JVD. No carotid bruits.  RESPIRATORY: Decreased breath sounds in the bases. No audible rales, rhonchi or respiratory distress noted.  CARDIOVASCULAR: Both the heart sounds are normal. Rate, rhythm regular. PMI not lateralized. Chest nontender.  EXTREMITIES: Feeble pedal pulses. There is pitting edema, 3+ in both lower extremities up to the knee joint.  SKIN: Warm and dry.  PSYCHIATRIC: The patient is awake, alert, oriented x 3.   IMAGING AND LABORATORY DATA: Chest x-ray consistent with congestive heart failure.   UA negative for UTI. B-type natriuretic peptide is 15,000. First set of cardiac enzymes negative. White count is 11.0, creatinine is 1.4, sodium 126. H and H is 10.8 and 35.3, platelet count is 117,000.   EKG shows bifascicular block. Anteroseptal infarct appears old.   ASSESSMENT: [age]-year-old Mathew Morris with multiple medical problems, comes in with increasing shortness of breath, leg edema. He is being admitted with:  1. Acute on chronic congestive heart failure. The patient's saturations were normal initially, however, he desaturation into upper 80s while talking. He is going to be admitted. His chest x-ray is consistent with congestive heart failure along with increasing leg edema. We will admit him to telemetry floor. The patient does not appear to be in severe respiratory distress. He will get Lasix 40 mg IV 3 times a day, monitor I's and O's, daily weights and creatinine closely. Cardiology has been consulted.  2. Chronic kidney  disease stage 3/4. The patient's creatinine appears stable at this time.  3. Hyponatremia. Suspected due to volume overload with congestive heart failure. We will monitor sodium with IV diuretics.  4. Anemia of chronic disease, appears stable.  5. Deep venous thrombosis prophylaxis. The patient will be on heparin subcutaneous.  6. Hypertension. We will continue carvedilol and Imdur.  7. History of coronary artery disease, on aspirin and Plavix.  8. Hyperlipidemia, on atorvastatin.   CODE STATUS: Discussed with the patient, wants to be a full code.   NOTE: We will have CHF Clinic consult also done.  Above was discussed with the patient, the patient's wife who was present in the Emergency Room.   TIME SPENT: Fifty-five minutes.  ____________________________ Hart Rochester Posey Pronto, MD sap:TT D: 04/24/2014 14:41:00 ET T: 04/24/2014 15:38:19 ET JOB#: [id number]  cc: Chrissie Noa A. Ubaldo Glassing, MD Mathew Morris. Mathew Kos, MD Brynnan Rodenbaugh A. Posey Pronto, MD, <Dictator>    Ilda Basset MD ELECTRONICALLY SIGNED 05/06/2014 12:49

## 2014-11-09 NOTE — Consult Note (Signed)
PATIENT NAME:  Mathew Morris, Mathew Morris MR#:  [id number] DATE OF BIRTH:  12-Apr-1924  DATE OF CONSULTATION:  10/01/2013  REFERRING PHYSICIAN: Dr. Laurin Coder   CONSULTING PHYSICIAN:  Isaias Cowman, MD  PRIMARY CARE PHYSICIAN: Dr. Kary Kos.  REASON FOR CONSULTATION: Congestive heart failure.   HISTORY OF PRESENT ILLNESS: The patient is an 81 year old gentleman with single vessel coronary artery disease, mildly reduced left ventricular function who is admitted with respiratory failure. The patient was in her usual state of health until recently developed increasing fatigue, peripheral edema, chest pain and shortness of breath. The patient presented to [facility] Emergency Room where he was noted to be markedly hypertensive and hypoxic. Chest x-ray revealed evidence for possible right lower lobe pneumonia with pulmonary edema. The patient required intubation and currently is on a ventilator. Admission labs were notable for borderline elevated troponin of 0.27.   PAST MEDICAL HISTORY:  1.  Single vessel coronary artery disease with 70% stenosis in the proximal RCA and 100% distal RCA by cardiac catheterization 08/15/2013.  2.  Pulmonary hypertension.  3.  Sleep apnea.  4.  Chronic kidney disease.  5.  Hypothyroidism.  6.  Chronic anemia.  7.  History of gout.  8.  Osteoarthritis.  9.  History of non-small cell cancer, status post lobectomy left lung 10 years ago.  HOME MEDICATIONS: Prednisone 5 mg daily, clopidogrel 75 mg daily, nitroglycerin p.r.n., isosorbide mononitrate 2 tabs daily, furosemide 120 mg daily, Flomax 0.4 mg daily, finasteride 5 mg daily, ferrous sulfate 325 mg daily, Dexilant 60 mg daily, Colcrys 0.6 mg daily, carvedilol 6.25 mg b.i.d., atorvastatin 10 mg daily, aspirin 1 daily, allopurinol 100 mg daily.   SOCIAL HISTORY: The patient is currently intubated. Social history not obtainable.  FAMILY HISTORY: Not obtainable.  REVIEW OF SYSTEMS: Not obtainable.   PHYSICAL EXAMINATION:  VITAL  SIGNS: Blood pressure 167/95, pulse 118, respirations 35, temperature 98.1, pulse oximetry 91%.  HEENT: The patient is currently intubated.  NECK: Supple without thyromegaly.  LUNGS: Dependent crackles.  CARDIOVASCULAR: Normal JVP. Normal PMI. Regular rate and rhythm. Normal S1, S2. No appreciable gallop, murmur, or rub.  ABDOMEN: Soft and nontender.  EXTREMITIES: There is no cyanosis, or clubbing. The patient had 1+ bilateral pedal edema.  MUSCULOSKELETAL: Normal muscle tone.  NEUROLOGIC: The patient currently sedated and intubated.   IMPRESSION: An 81 year old gentleman with known coronary artery disease, history of pulmonary hypertension, sleep apnea and chronic kidney disease, who presents with respiratory failure, probable pneumonia consistent with chest x-ray findings, elevated white count with an element of congestive heart failure primarily diastolic dysfunction. The patient has borderline elevated troponin likely due to demand supply ischemia and not due to acute coronary syndrome in light of the patient's current comorbidities.   RECOMMENDATIONS:  1.  I agree with overall current therapy.  2.  Continue heparin drip for now, would continue for 48 to 72 hours then discontinue.  3.  Continue diuresis.  4.  Would defer further noninvasive or invasive cardiac evaluation at this time.  ____________________________ Isaias Cowman, MD ap:aw D: 10/01/2013 12:56:40 ET T: 10/01/2013 15:30:57 ET JOB#: [id number]  cc: Isaias Cowman, MD, <Dictator> Isaias Cowman MD ELECTRONICALLY SIGNED 10/09/2013 12:46

## 2014-11-09 NOTE — Consult Note (Signed)
Brief Consult Note: Diagnosis: Resp failure/CHF/CM.   Patient was seen by consultant.   Consult note dictated.   Recommend further assessment or treatment.   Orders entered.   Discussed with Attending MD.   Comments: IMP Resp Failure CHF CM CRI Elevated troponins CAD COPD Hx Lung Ca HTN Hyperlipidemia . PLAN ICU Resp suport Bipap/CPAP Supplemental 02 Diuresis IV Short term anticoug I do not rec cardiac cath at this point Supportive care for now.  Electronic Signatures: Lujean Amel D (MD)  (Signed 10-Jul-15 06:54)  Authored: Brief Consult Note   Last Updated: 10-Jul-15 06:54 by Yolonda Kida (MD)

## 2014-11-09 NOTE — Discharge Summary (Signed)
PATIENT NAME:  Mathew Morris, Mathew Morris MR#:  [id number] DATE OF BIRTH:  Jun 12, 1924  DATE OF ADMISSION:  04/25/2014 DATE OF DISCHARGE:  04/26/2014  PRIMARY CARE PHYSICIAN:  Irven Easterly. Kary Kos, MD.   CARDIOLOGIST:  Javier Docker. Ubaldo Glassing, MD.  FINAL DIAGNOSES:  1.  Acute on chronic systolic congestive heart failure with anasarca.  2.  Hyponatremia.  3.  Hypertension.  4.  Chronic kidney disease stage II.  5.  Anemia of chronic disease.  6.  Benign prostatic hypertrophy.   MEDICATIONS ON DISCHARGE INCLUDE: Acetaminophen/hydrocodone 5/325 one tablet every 6 hours as needed for pain, ferrous sulfate 325 mg twice a day, finasteride 5 mg daily, Colcrys 0.6 mg 1-2 tablets once a day as needed for gout flare-ups, nitroglycerin 0.4 mg every 5 minutes as needed for chest pain, allopurinol 100 mg twice a day, aspirin 325 mg daily, atorvastatin 10 mg at bedtime, carvedilol 6.25 mg twice a day, Plavix 75 mg daily, dexlansoprazole 60 mg 1 capsule at bedtime, Imdur 120 mg daily, furosemide 80 mg daily, potassium chloride 10 mEq daily, MiraLax 17 grams once a day, Altace 2.5 mg daily, oxygen 2 liters nasal cannula.   DIET:  Low sodium diet.  Diet consistency puree, pudding-thick liquids, fluid restriction 1500 mL a day.   TREATMENT:  CPAP at night.   ACTIVITY:  As tolerated with physical therapy.   FOLLOWUP:  In 1 week with Dr. Ubaldo Glassing, cardiology.  Follow up in 1-2 weeks with doctor at rehabilitation.   HOSPITAL COURSE: The patient was admitted 04/24/2014 and discharged 04/26/2014. The patient came in with difficulty breathing and leg swelling; history of systolic congestive heart failure, EF of 30-35%, chronic kidney disease. Came in with swelling and weight gain of about 5 pounds. He was started on IV Lasix 40 mg IV 3 times a day. His hyponatremia improved with diuresis.   LABORATORY AND RADIOLOGICAL DATA DURING THE HOSPITAL COURSE INCLUDED:  A troponin borderline at 0.14, white blood cell count 11.0, H and H 10.8 and 35.3,  platelet count 117, glucose 111, BUN 19, creatinine 1.44, sodium 126, potassium 3.6, chloride 107, CO2 of 32, calcium 8.7. Liver function tests: Alkaline phosphatase 143, ALT 52, AST 38, BNP 15,975.  Troponin is borderline at 0.13. Creatinine upon discharge 1.4, sodium 142, potassium 4.0, CO2 of 36, glucose 85.   Urinalysis 2+ blood, otherwise negative.   EKG showed right bundle branch block, left anterior fascicular block.   Chest x-ray showed, "suspect mild CHF. Mild bibasilar atelectasis, left pleural effusion."  HOSPITAL COURSE PER PROBLEM LIST:  1.  For the patient's acute on chronic systolic congestive heart failure and anasarca, cardiomyopathy, pulmonary hypertension: The patient was given IV Lasix 40 mg IV q. 8 hours. He was diuresed for 2 days. The bed weights are hard to predict here, but he did urinate and it is negative. His lungs are clear upon discharge. Dr. Ubaldo Glassing switched him back to his oral Lasix. The patient is clinically stable to go to rehabilitation for further monitoring with daily weights. He is already on Coreg, Imdur, Lasix, (Dictation Anomaly)<<MISSING TEXT>>, Altace 2.5 mg daily, can consider spironolactone as an outpatient. Recommend checking a BMP in 1 week. Clinically, from his heart failure standpoint, he is stable. He has chronic anasarca; diuresis can be done slowly over time.  2.  Hyponatremia from CHF:  This had improved rapidly with the IV Lasix.  3.  Hypertension: Blood pressure stable on usual medications.  4.  Chronic kidney disease stage II:  Creatinine actually  improved with diuresis and stabilized around 1.4.  5.  Anemia of chronic disease.  6.  BPH:  On finasteride.  7.  Hyperlipidemia:  On atorvastatin.  8.  Gastroesophageal reflux disease:  On dexlansoprazole. He is also on dysphagia diet and has chronic dysphagia.  9.  Gout:  On allopurinol.    Time spent on discharge: 35 minutes.   Case discussed with Dr.  Ubaldo Glassing.   ____________________________ Tana Conch. Leslye Peer, MD rjw:lt D: 04/26/2014 10:22:00 ET T: 04/26/2014 11:03:06 ET JOB#: [id number]  cc: Chrissie Noa A. Ubaldo Glassing, MD  Irven Easterly. Kary Kos, MD  [place] Leslye Peer, MD, <Dictator>

## 2014-11-09 NOTE — H&P (Signed)
PATIENT NAME:  Mathew Morris, Mathew Morris MR#:  [id number] DATE OF BIRTH:  1924-04-14  DATE OF ADMISSION:  10/01/2013  REFERRING PHYSICIAN: Charlesetta Ivory, MD   PRIMARY CARE PHYSICIAN: [place] Pink, MD   REASON FOR ADMISSION: Acute respiratory failure due to pneumonia and CHF exacerbation.   HISTORY OF PRESENT ILLNESS: This is a very nice 81 year old gentleman with history of coronary artery disease, single vessel, with previous cardiac catheterization done back on August 15, 2013 by Dr. Ubaldo Glassing showing an ejection fraction of about 40% with significant single vessel coronary artery. Apparently, the patient has a 50% diffuse stenosis at the level of the proximal LAD, 20% stenosis of the marginal, 70% stenosis at the level of the proximal RCA, RCA with 100% stenosis. At that point, the grade of flow was zero on the TIMI scale. No stents or procedures were done at that point. The patient has been doing okay, his normal self except for being slightly tired and starting to get a little bit of edema to the lower extremities. The patient is not able to give me any information as he is on BiPAP with respiratory distress, but the wife was  able to provide most of the details. The patient has been his normal self, except at 3:00 a.m. in the morning woke up suddenly complaining of chest pain, epigastric pain, and shortness of breath. The patient was wearing his CPAP, but that was not enough. He did not have any chills, any fever. The wife states no weight gain, but she noticed the edema, and it seems like they have been going on for several days. The patient does monitor his weight on a regular basis. At this moment, the patient is not having any chest pain anymore, but his first cardiac enzyme set is elevated. The patient came up with a blood pressure of 216/120, a pulse of 129, respirations of 40, and a temperature of 98.2. The patient had an oxygen saturation of 91% on several liters of oxygen, but he was straight put onto a  BiPAP machine and he is starting to improve. His vitals and symptoms started to normalize. He became more relaxed and now he is overall stable. The patient is not complaining of any pain anymore and he feels like his shortness of breath has improved. Overall, the patient is shown to have a possible right lower lobe pneumonia with pulmonary edema. The patient is treated with Lasix and antibiotics and a BiPAP. The patient is admitted to the stepdown unit.   The patient also has history of swallowing problems. In the past, he has been admitted due to having epiglottitis, by Dr. Kathyrn Sheriff, and as per the wife the patient always coughs a lot whenever he is eating. We are going to do a swallow evaluation as this could be related to aspiration pneumonia.   REVIEW OF SYSTEMS: Unable to obtain due to the patient's respiratory failure, on BiPAP.   PAST MEDICAL HISTORY: 1.  Coronary artery disease, single vessel, as mentioned above.  2.  Chronic kidney disease.  3.  Hypothyroidism.  4.  BPH. 5.  Gout.  6.  Chronic disease anemia.  7.  Osteoarthritis.  8.  Obesity.  9.  Severe sleep apnea.  10.  Pulmonary hypertension.  11.  Non-small cell cancer, status post lobectomy on the left lung 10 years ago or more.  12.  Peripheral vascular disease.  13.  GERD.  PAST SURGICAL HISTORY:  1.  Carpal tunnel release surgery. 2.  Thyroidectomy.  3.  Cardiac catheterization.  4.  Status post left lower lobe surgical excision of lung cancer.  5.  Carotid stenosis status post stent placement.   ALLERGIES: No known drug allergies.  SOCIAL HISTORY: The patient is a former smoker of 1 pack over 20 years, but he quit probably 30 years ago. He does not drink. He lives with his wife.   FAMILY HISTORY: Positive for lung cancer in his father and his brother had a MI.  CURRENT MEDICATIONS: Systane eyedrops as needed, prednisone 5 mg as needed for gout exacerbations.     ____________________________ [place] Sink, MD rsg:sb D: 10/01/2013 08:02:46 ET T: 10/01/2013 08:23:30 ET JOB#: [id number]  cc: [place] Sink, MD, <Dictator> Ixel Boehning America Brown MD ELECTRONICALLY SIGNED 10/14/2013 13:47

## 2014-11-09 NOTE — Discharge Summary (Signed)
PATIENT NAME:  Mathew Morris, Mathew Morris MR#:  [id number] DATE OF BIRTH:  1924/07/10  DATE OF ADMISSION:  04/24/2014 DATE OF DISCHARGE:  04/26/2014  PRIMARY CARE PHYSICIAN:  Irven Easterly. Kary Kos, MD.   CARDIOLOGIST:  Javier Docker. Ubaldo Glassing, MD.  FINAL DIAGNOSES:  1.  Acute on chronic systolic congestive heart failure with anasarca.  2.  Hyponatremia.  3.  Hypertension.  4.  Chronic kidney disease stage II.  5.  Anemia of chronic disease.  6.  Benign prostatic hypertrophy.   MEDICATIONS ON DISCHARGE INCLUDE: Acetaminophen/hydrocodone 5/325 one tablet every 6 hours as needed for pain, ferrous sulfate 325 mg twice a day, finasteride 5 mg daily, Colcrys 0.6 mg 1-2 tablets once a day as needed for gout flare-ups, nitroglycerin 0.4 mg every 5 minutes as needed for chest pain, allopurinol 100 mg twice a day, aspirin 325 mg daily, atorvastatin 10 mg at bedtime, carvedilol 6.25 mg twice a day, Plavix 75 mg daily, dexlansoprazole 60 mg 1 capsule at bedtime, Imdur 120 mg daily, furosemide 80 mg daily, potassium chloride 10 mEq daily, MiraLax 17 grams once a day, Altace 2.5 mg daily, oxygen 2 liters nasal cannula.   DIET:  Low sodium diet.  Diet consistency puree, pudding-thick liquids, fluid restriction 1500 mL a day.   TREATMENT:  CPAP at night.   ACTIVITY:  As tolerated with physical therapy.   FOLLOWUP:  In 1 week with Dr. Ubaldo Glassing, cardiology.  Follow up in 1-2 weeks with doctor at rehabilitation.   HOSPITAL COURSE: The patient was admitted 04/24/2014 and discharged 04/26/2014. The patient came in with difficulty breathing and leg swelling; history of systolic congestive heart failure, EF of 30-35%, chronic kidney disease. Came in with swelling and weight gain of about 5 pounds. He was started on IV Lasix 40 mg IV 3 times a day. His hyponatremia improved with diuresis.   LABORATORY AND RADIOLOGICAL DATA DURING THE HOSPITAL COURSE INCLUDED:  A troponin borderline at 0.14, white blood cell count 11.0, H and H 10.8 and 35.3,  platelet count 117, glucose 111, BUN 19, creatinine 1.44, sodium 126, potassium 3.6, chloride 107, CO2 of 32, calcium 8.7. Liver function tests: Alkaline phosphatase 143, ALT 52, AST 38, BNP 15,975.  Troponin is borderline at 0.13. Creatinine upon discharge 1.4, sodium 142, potassium 4.0, CO2 of 36, glucose 85.   Urinalysis 2+ blood, otherwise negative.   EKG showed right bundle branch block, left anterior fascicular block.   Chest x-ray showed, "suspect mild CHF. Mild bibasilar atelectasis, left pleural effusion."  HOSPITAL COURSE PER PROBLEM LIST:  1. For the patient's acute on chronic systolic congestive heart failure and anasarca, cardiomyopathy, pulmonary hypertension: The patient was given IV Lasix 40 mg IV q. 8 hours. He was diuresed for 2 days. The bed weights are hard to predict here, but he did urinate and it is negative. His lungs are clear upon discharge. Dr. Ubaldo Glassing switched him back to his oral Lasix. The patient is clinically stable to go to rehabilitation for further monitoring with daily weights. He is already on Coreg, Imdur, Lasix, Altace 2.5 mg daily, can consider spironolactone as an outpatient. Recommend checking a BMP in 1 week. Clinically, from his heart failure standpoint, he is stable. He has chronic anasarca; diuresis can be done slowly over time.  2. Hyponatremia from CHF:  This had improved rapidly with the IV Lasix.  3. Hypertension: Blood pressure stable on usual medications.  4. Chronic kidney disease stage II:  Creatinine actually improved with diuresis and stabilized around 1.4.  5. Anemia of chronic disease.  6. BPH:  On finasteride.  7. Hyperlipidemia:  On atorvastatin.  8. Gastroesophageal reflux disease:  On dexlansoprazole. He is also on dysphagia diet and has chronic dysphagia.  9. Gout:  On allopurinol.    TIME SPENT ON DISCHARGE: 35 minutes.   Case discussed with Dr. Ubaldo Glassing.   ____________________________ Tana Conch. Leslye Peer, MD rjw:lt D: 04/26/2014  10:22:00 ET T: 04/26/2014 11:03:06 ET JOB#: [id number]  cc: Chrissie Noa A. Ubaldo Glassing, MD Irven Easterly. Kary Kos, MD [place] Leslye Peer, MD, <Dictator>   Marisue Brooklyn MD ELECTRONICALLY SIGNED 05/05/2014 12:51

## 2014-11-09 NOTE — H&P (Signed)
PATIENT NAME:  Mathew Morris, Mathew Morris MR#:  [id number] DATE OF BIRTH:  1924-03-25  DATE OF ADMISSION:  01/24/2014  PRIMARY CARE PHYSICIAN: Dr. Kary Kos.   CHIEF COMPLAINT: Difficulty breathing.   HISTORY OF PRESENT ILLNESS: Mathew Morris is an 81 year old African American male with past medical history significant for ischemic cardiomyopathy, congestive heart failure, systolic dysfunction, ejection fraction of 30%, coronary artery disease as noted by cardiac catheterization in January 2015, CKD stage III, hypothyroidism, chronic anemia, sleep apnea, history of lung cancer, status post lobectomy in the left lung who presents from home secondary to difficulty breathing that worsened last night. The patient is a very poor historian. His wife is at bedside and says that the patient has been having trouble sleeping at night time for the last 3 nights, which sounds more like orthopnea, and he had to sleep in a recliner. Last night, he could not get enough rest at all because he was having extremely troubled breathing and presents to the ER today.  He denies any chest pain currently, but he has had a couple of episodes of chest pains in the last week.  When he had trouble breathing last night, he had his second episode of chest pain, which was mid-sternal and 5/10 non-radiating, pressure-like pain.  Denies any palpitations. No nausea, vomiting, no recent travel. He was admitted here in March 9417 for acute systolic congestive heart failure exacerbation, at which time he intubation. His CODE STATUS at the time was changed to DO NOT RESUSCITATE after discussion with palliative care medicine; however, patient now states that he is a full code and he has reversed his CODE STATUS after seeing his physician recently. At that time, the patient was discharged to a rehabilitation, now currently staying at home and finished his home health services.  He is not on any home oxygen. His saturations were 95% on room air when he came in  initially, but during my examination actually, he started desaturating and his saturations are now 85% on 2 liters nasal cannula and I bumped him up to 6 liters nasal cannula and the ABG is pending at this time.   PAST MEDICAL HISTORY:  1.  Ischemic cardiomyopathy.  2.  Coronary artery disease, status post cardiac catheterization in January 2015, showing 100% RCA blockage with collaterals.  Medical management recommended and 70% proximal RCA blockage, 50% season proximal LAD.  3.  Congestive heart failure with both systolic and diastolic dysfunction, ejection fraction of 30%.  4.  Obstructive sleep apnea.  5.  Benign prostatic hypertrophy.  6.  Hypothyroidism.  7.  Anemia of chronic disease.  8.  Osteoarthritis.  9.  Pulmonary hypertension.  10.  History of non-small cell lung cancer, status post lobectomy of left lung.  11.  Peripheral vascular disease.  12.  Gastroesophageal reflux disease.  13.  History of dysphagia. We will try scope aspiration.  14.  CKD stage III.   PAST SURGICAL HISTORY:  1.  Cardiac catheterization.  2.  Carpal tunnel surgery. 3.  Partial thyroidectomy.  4.  Left lower lobe resection for lung cancer.  5.  Carotid stenosis, status post stent placement in right carotid artery.   ALLERGIES TO MEDICATIONS: NO KNOWN DRUG ALLERGIES.   CURRENT HOME MEDICATIONS: His home medication list is not available at this time. We are trying to reach his wife for his home medication list.  However, his discharge home medications from March 2015 include:  1.  Atorvastatin 10 mg p.o. daily.  2.  Plavix 75  mg p.o. daily.  3.  Hydrocodone/acetaminophen 5/325 mg 1 tablet q. 6 hours p.r.n.  4.  Iron sulfate 325 mg p.o. b.i.d.  5.  Finasteride 5 mg p.o. at bedtime.  6.  Prednisone 5 mg p.o. daily.  7.  Lotrisone 0.05% topical cream to affected area twice a day.  8.  Dexilant 60 mg p.o. daily.  9.  Flomax 0.4 mg p.o. daily.  10.  Coreg 6.25 mg p.o. b.i.d.  11.  Lasix 120 mg p.o.  daily.  12.  Imdur 60 mg 2 tablet p.o. daily.  13.  Sublingual nitroglycerin 0.4 mg p.r.n. for chest pain.  14.  Systane eye drops each eye twice a day as needed.  15.  Allopurinol 100 mg 1-1/2 in the tablet daily.  16.  Colcrys 0.6 mg p.o. daily as needed.  17.  Aspirin 325 mg p.o. daily.  18.  Lisinopril 2.5 mg p.o. daily.   SOCIAL HISTORY: Lives at home with his wife. Ambulates using a cane, but gets short of breath easily.  Used to smoke, but quit several years ago. No alcohol use.   FAMILY HISTORY: Significant for hypertension and heart disease in the family.   REVIEW OF SYSTEMS:  CONSTITUTIONAL: No fever, fatigue or weakness.  EYES: No blurred vision, double vision, inflammation or glaucoma.  ENT: No tinnitus, ear pain, hearing loss, epistaxis or discharge.  RESPIRATORY: No cough, wheeze, hemoptysis or COPD.  CARDIOVASCULAR: Positive for chest pain and orthopnea, dyspnea on exertion presents. No palpitations or syncope.  GASTROINTESTINAL: No nausea, vomiting, diarrhea, abdominal pain, hematemesis, or melena.  GENITOURINARY: No dysuria, hematuria, renal calculus with concerning incontinence.  ENDOCRINE: No polyuria, nocturia, thyroid problems, heat or cold intolerance.  HEMATOLOGY: No anemia, easy bruising or bleeding.  SKIN: No acne, rash or lesions.  MUSCULOSKELETAL: Positive for gout and arthritis.  NEUROLOGICAL: No numbness, weakness, CVA, transient ischemic attack or seizures.  PSYCHOLOGICAL: No anxiety, insomnia or depression.   PHYSICAL EXAMINATION:  VITAL SIGNS: Temperature 97.2 degrees Fahrenheit, pulse 79, respirations 20, blood pressure 124/58, pulse oximetry 95% on room air.  GENERAL: Well-built, well-nourished male lying in bed, not in any acute distress.  HEENT: Normocephalic, atraumatic. Pupils equal, round, reacting to light. Anicteric sclerae. Extraocular movements intact.  Oropharynx is clear without erythema, mass or exudates.  NECK: Supple. No thyromegaly, JVD  or carotid.  LUNGS: Moving air bilaterally. Fine bibasilar crackles, worse on the right side. Few rhonchi heard. No wheezing noted. No use of accessory muscles for breathing.  CARDIOVASCULAR: S1, S2, regular rate and rhythm. A 3/6 systolic murmur heard. No rubs or gallops.  ABDOMEN: Soft, nontender, nondistended. No hepatosplenomegaly. Normal bowel sounds.   EXTREMITIES: He does have 1+ pedal edema. No clubbing or cyanosis.  Two plus dorsalis pedis pulses palpable bilaterally.  SKIN: No acne, rash or lesions.  LYMPHATICS: No cervical or inguinal lymphadenopathy.  NEUROLOGIC: Cranial nerves intact. No focal motor or sensory deficits.  PSYCHOLOGICAL: The patient is awake, alert, oriented x 3.   LABORATORY DATA: WBC 9.2, hemoglobin 11.3, hematocrit 35.1, platelet count 114,000.   Sodium 141, potassium 4.2, chloride 102, bicarbonate 32, BUN 26, creatinine 1.68, glucose of 109 and calcium of 9.0. BNP is elevated at 13,124, and chest x-ray revealing mild airspace disease in right lower lobe suggesting pulmonary edema, improvement in right pleural effusion compared to prior exam and chronic worsening atelectasis in left lower lung.  On his EKG,  normal sinus rhythm, right bundle branch block, heart rate of 79. No acute ST-T  wave changes noted.   ASSESSMENT AND PLAN: An 81 year old male with history of systolic congestive heart failure, ejection fraction of 30% last hospitalization actually requiring intubation for congestive heart failure exacerbation, hypertension, chronic kidney disease, coronary artery disease, and anemia brought in secondary to dyspnea and orthopnea, now noted to be hypoxic.   1.  Acute hypoxic respiratory failure. Initial sats were normal, now he is desaturating and requiring 6 liters O2,  likely acute on chronic congestive heart failure exacerbation. ABG has been ordered. We will admit the patient to Critical Care Unit stepdown. Chest x-ray revealing pulmonary edema and chronic  atelectasis.  We will start diuresis with IV Lasix at this time.  V/Q scan in the a.m. to rule out PE, especially with his significant hypoxia. However, if he persistently tends to be hypoxic, we will go ahead and start empiric IV heparin.  Cardiology has been consulted.   2.  Elevated troponin, mild elevation secondary to demand ischemia from congestive heart failure, but he does have significant coronary artery disease with his cardiac catheterization revealing 100% RCA blockage, and significant proximal RCA blockage in January 2015. The patient is having occasional chest pain at this time, so we will treat with IV heparin, aspirin, Plavix, and statin if the second enzymes gets elevated, then we will have to call it as non-ST segment elevation myocardial infarction.  3. Chronic kidney disease, stage III, stable at this time.   4. Anemia of chronic disease, appears stable.  5.  Deep vein thrombosis prophylaxis. The patient will be on heparin drip.   6.  Of note, he patient's CODE STATUS IS FULL CODE, because he said he has reversed his DO NOT RESUSCITATE status since his last meeting with his physician after his discharge.   TOTAL CRITICAL CARE TIME SPENT ON ADMITTING THIS PATIENT:   60 minutes.   ____________________________ Gladstone Lighter, MD rk:ts D: 01/24/2014 16:44:20 ET T: 01/24/2014 18:18:58 ET JOB#: [id number]  cc: Gladstone Lighter, MD, <Dictator> Irven Easterly. Kary Kos, MD Javier Docker Ubaldo Glassing, MD Gladstone Lighter MD ELECTRONICALLY SIGNED 01/25/2014 12:02

## 2014-11-09 NOTE — Consult Note (Signed)
Chief Complaint:  Subjective/Chief Complaint Pt sitting chair state tofeel a little better than yesterday. R neck still ozing . SOB improved but persistent.   VITAL SIGNS/ANCILLARY NOTES: **Vital Signs.:   11-Jul-15 12:00  Vital Signs Type Routine  Temperature Temperature (F) 97.6  Celsius 36.4  Temperature Source oral  Pulse Pulse 68  Respirations Respirations 18  Systolic BP Systolic BP 001  Diastolic BP (mmHg) Diastolic BP (mmHg) 65  Mean BP 79  Pulse Ox % Pulse Ox % 99  Pulse Ox Activity Level  At rest  Oxygen Delivery 2L  *Intake and Output.:   11-Jul-15 11:30  Grand Totals Intake:   Output:      Net:   63 Hr.:  -80  Unmeasured Intake  kept tray \   Brief Assessment:  GEN well nourished, thin   Cardiac Regular  murmur present   Respiratory normal resp effort  no use of accessory muscles  rhonchi   Gastrointestinal Normal   Gastrointestinal details normal Soft   EXTR positive cyanosis/clubbing, positive edema   Lab Results: Routine Chem:  11-Jul-15 05:47   Glucose, Serum 98  BUN  30  Creatinine (comp)  1.73  Sodium, Serum 142  Potassium, Serum  3.4  CO2, Serum  34  Calcium (Total), Serum 8.6  Anion Gap 7  Osmolality (calc) 289  eGFR (African American)  40  eGFR (Non-African American)  34 (eGFR values <19m/min/1.73 m2 may be an indication of chronic kidney disease (CKD). Calculated eGFR is useful in patients with stable renal function. The eGFR calculation will not be reliable in acutely ill patients when serum creatinine is changing rapidly. It is not useful in  patients on dialysis. The eGFR calculation may not be applicable to patients at the low and high extremes of body sizes, pregnant women, and vegetarians.)  Result Comment - This specimen was collected through an   - indwelling catheter or arterial line.  - A minimum of 561m of blood was wasted prior    - to collecting the sample.  Interpret  - results with caution.  Result(s) reported on  26 Jan 2014 at 06:46AM.  Result Comment labs - This specimen was collected through an   - indwelling catheter or arterial line.  - A minimum of 79m46mof blood was wasted prior    - to collecting the sample.  Interpret  - results with caution.  Result(s) reported on 26 Jan 2014 at 06:35AM.    21:36   Result Comment LABS - This specimen was collected through an   - indwelling catheter or arterial line.  - A minimum of 79ml32mf blood was wasted prior    - to collecting the sample.  Interpret  - results with caution.  Result(s) reported on 26 Jan 2014 at 10:02PM.  Routine Hem:  11-Jul-15 05:47   WBC (CBC) 8.8  RBC (CBC)  3.55  Hemoglobin (CBC)  10.3  Hematocrit (CBC)  32.7  Platelet Count (CBC)  106  MCV 92  MCH 29.0  MCHC  31.5  RDW  18.9  Neutrophil % 72.3  Lymphocyte % 13.3  Monocyte % 8.1  Eosinophil % 5.6  Basophil % 0.7  Neutrophil # 6.4  Lymphocyte # 1.2  Monocyte # 0.7  Eosinophil # 0.5  Basophil # 0.1 (Result(s) reported on 26 Jan 2014 at 06:35AM.)    21:36   WBC (CBC) 7.4  RBC (CBC)  3.41  Hemoglobin (CBC)  9.9  Hematocrit (CBC)  30.9  Platelet Count (  CBC)  101  MCV 90  MCH 29.0  MCHC 32.1  RDW  19.1  Neutrophil % 70.3  Lymphocyte % 14.2  Monocyte % 8.9  Eosinophil % 6.1  Basophil % 0.5  Neutrophil # 5.2  Lymphocyte # 1.0  Monocyte # 0.7  Eosinophil # 0.5  Basophil # 0.0   Radiology Results: XRay:    09-Jul-15 13:44, Chest PA and Lateral  Chest PA and Lateral   REASON FOR EXAM:    sob  COMMENTS:       PROCEDURE: DXR - DXR CHEST PA (OR AP) AND LATERAL  - Jan 24 2014  1:44PM     CLINICAL DATA:  Short of breath    EXAM:  CHEST  2 VIEW    COMPARISON:  Radiograph 319 1,015    FINDINGS:  Normal cardiac silhouette. Improvement and right lower lobe a fusion  compared to prior. There is chronic left lower lobe atelectasis and  pleural thickening. There is fine airspace disease in the right  lower lobe. Upper lungs are clear. Bone enostosis noted  in the  posterior left fourth rib.     IMPRESSION:  1. Mild airspace disease in the right lower lobe suggesting  pulmonary edema versus infection. Favor edema.  2. Interval improvement in right pleural effusion compared to prior  exam.  3. Chronic pleural thickening and atelectasis in the left lower  lobe.      Electronically Signed    By: Suzy Bouchard M.D.    On: 01/24/2014 13:56         Verified By: Rennis Golden, M.D.,    09-Jul-15 18:16, Chest Portable Single View  Chest Portable Single View   REASON FOR EXAM:    check central line placement  COMMENTS:       PROCEDURE: DXR - DXR PORTABLE CHEST SINGLE VIEW  - Jan 24 2014  6:16PM     CLINICAL DATA:  Line placement.    EXAM:  PORTABLE CHEST - 1 VIEW    COMPARISON:  CT 01/24/2014.  Chest x-ray07/03/2014.    FINDINGS:  Right IJ line in good anatomic position. Tip at the cavoatrial  junction. Mediastinum and hilar structures are normal. Bibasilar  infiltrates with a prominent left effusion noted. No pneumothorax.  Cardiomegaly with normal pulmonary vascularity. No acute bony  abnormality. No acute abnormality     IMPRESSION:  1. Right IJ line in good anatomic position.  2. Bibasilar prominent infiltrates consistent with pneumonia. Left  pleural effusion.  3. Cardiomegaly.      Electronically Signed    By: Marcello Moores  Register    On: 01/24/2014 18:25     Verified By: Osa Craver, M.D., MD  Cardiology:    09-Jul-15 12:23, ED ECG  Ventricular Rate 79  Atrial Rate 79  P-R Interval 204  QRS Duration 140  QT 444  QTc 509  P Axis 3  R Axis -64  T Axis 63  ECG interpretation   Normal sinus rhythm  Left axis deviation  Right bundle branch block  Anteroseptal infarct (cited on or before 01-Oct-2013)  Abnormal ECG  When compared with ECG of 01-Oct-2013 04:08,  Vent. rate has decreased BY  50 BPM  Questionable change in initial forces of Septal leads  ----------unconfirmed----------  Confirmed by  OVERREAD, NOT ([id number]), editor PEARSON, BARBARA ([id number]) on 01/25/2014 12:58:06 PM  ED ECG   CT:    09-Jul-15 17:33, CT Chest Without Contrast  CT Chest Without Contrast  REASON FOR EXAM:    pneumonia, hypoxia  COMMENTS:       PROCEDURE: CT  - CT CHEST WITHOUT CONTRAST  - Jan 24 2014  5:33PM     CLINICAL DATA:  History of CHF and pneumonia and recent  hospitalization for staining; exertional shortness of breath since  last night    EXAM:  CT CHEST WITHOUT CONTRAST    TECHNIQUE:  Multidetector CT imaging of the chest was performed following the  standard protocol without IV contrast..  COMPARISON:  PA and lateral chest x-ray of today's date.    FINDINGS:  There is a moderate-sized right pleural effusion layering  posteriorly. There is a smaller left pleural effusionthat is at  least in part loculated inferior laterally. There is confluent  airspace disease in the adjacent lung most conspicuously on the  right and to a lesser extent on the left inferiorly. The cardiac  chambers are mildly enlarged. There are coronary artery  calcifications. There is no pericardial effusion. The caliber of the  thoracic aorta is normal. No bulky lymphadenopathy is demonstrated.    Within the upper abdomen the observed portions of the liver and  spleen are normal. There is degenerative disc change at multiple  thoracic levels. There is calcification of the anterior longitudinal  ligament. The sternum and visualized ribs exhibit no acute  abnormalities.     IMPRESSION:  1. There is pneumonia in the right lower lobe and to a lesser extent  on the left. Are bilateral pleural effusions.  2. There is enlargement of the cardiac chambers and there is  coronary artery calcification. No more than mild pulmonary vascular  congestion is demonstrated.  3. There is multi level degenerative change of the thoracic spine.      Electronically Signed    By: Ayrton  Martinique    On: 01/24/2014 17:50     Verified By:  Cornellius A. Martinique, M.D., MD  Nuclear Med:    10-Jul-15 12:33, Lung VQ Scan - Nuc Med  Lung VQ Scan - Nuc Med   REASON FOR EXAM:    hypoxia  COMMENTS:       PROCEDURE: NM  - NM VQ LUNG SCAN  - Jan 25 2014 12:33PM     CLINICAL DATA:  Hypoxia    EXAM:  NUCLEAR MEDICINE VENTILATION - PERFUSION LUNG SCAN    Views: Anterior, posterior, left lateral, right lateral, RPO, LPO,  RAO, LAO -ventilation perfusion    Radionuclide: Technetium 22mDTPA -ventilation ; Technetium 94mmacroaggregated albumin- perfusion  Dose:  36.17 mCi-ventilation; 4.284 mCi-perfusion    Route of administration: Inhalation-ventilation; intravenous  -perfusion    COMPARISON:  Chest radiograph January 24, 2014 and chest CT January 24, 2014    FINDINGS:  On the ventilation study, there is decreased ventilation in the left  lower lobe in an area of consolidation noted on imaging studies.  There is mild decreased ventilation in the right base, also in areas  of effusion and consolidation noted on imaging studies. There is a  modest ventilation defect in the posterior segment of the right  upper lobe in an area of infiltrate. Elsewhere, ventilation appears  unremarkable.  On the perfusion study, there are matching defects in the left lower  lobe. There is an area of decreased radiotracer uptake in the  posterior segment of the right upper lobe which appears  disproportionate to the ventilation defect. No other segmental  ventilation/perfusion mismatch is seen.     IMPRESSION:  There is extensive infiltrate bilaterally on imaging studies. There  is a single segmental level perfusion defect in the posterior  segment right upper lobe whichappears more notable than the  corresponding ventilation defect. There are matching ventilation and  perfusion defects in the left base. This combination of findings  constitutes an intermediate probability for pulmonary embolus.    Electronically Signed    By: Lowella Grip  M.D.    On: 01/25/2014 13:05         Verified By: Leafy Kindle. WOODRUFF, M.D.,   Assessment/Plan:  Invasive Device Daily Assessment of Necessity:  Does the patient currently have any of the following indwelling devices? central line catheter   Central Line continued, requirement due to   Reason to continue [employer] for frequent venous blood sampling  would consider d/c   Assessment/Plan:  Assessment IMP SOB CAD CM CHF OSA Pneumonia Possble DVT ,   Plan PLAN D/C R jug central line because of bleeding Lower dose DVT prophylaxis Supplimental 02 as necessary Agreee with acute on chronic CHF therapy Diuretics asnecessary Agree with pulmonary in put IV antibx for pneumonia I do not rec invascive cardiac w/uat this point   Electronic Signatures: Lujean Amel D (MD)  (Signed 12-Jul-15 07:02)  Authored: Chief Complaint, VITAL SIGNS/ANCILLARY NOTES, Brief Assessment, Lab Results, Radiology Results, Assessment/Plan   Last Updated: 12-Jul-15 07:02 by Lujean Amel D (MD)

## 2014-11-09 NOTE — H&P (Signed)
PATIENT NAME:  Mathew Morris, Mathew Morris MR#:  [id number] DATE OF BIRTH:  10-29-1923  DATE OF ADMISSION:  10/01/2013  ADDENDUM: Continuation of dictation.  CONTINUATION OF MEDICATIONS: Plavix 75 mg once a day, nitroglycerin sublingual as needed for chest pain, Lotrisone cream as needed for dry skin, isosorbide 120 mg once a day, furosemide 80 mg take 1-1/2 tablets once a day, Flomax 0.4 mg once daily, finasteride 5 mg once daily, ferrous sulfate 325 mg twice daily, Dexilant 60 mg once a day, Colcrys 0.6 mg once a day, Coreg 6.25 mg twice daily, atorvastatin 10 mg once daily, aspirin 325 mg once daily, allopurinol 150 mg once daily, and Percocet as needed for pain.   PHYSICAL EXAMINATION: GENERAL: The patient looks more relaxed but is still critically ill. The patient is awake. He follows commands. He is in mild respiratory distress.  VITAL SIGNS: Blood pressure on admission 216/120, pulse 129, respirations 40, temperature 98.2. Most recent blood pressure shows 124/76 with a pulse of 73 and his respiratory rate has come down to 21.  HEENT: Pupils are equal and reactive. Extraocular movements are intact. Mucosa is dry. Anicteric sclerae. Pink conjunctivae. No oral lesions. No oropharyngeal exudates.  NECK: Supple. Positive JVD of 4 to 5 cm. No thyromegaly. No adenopathy. No carotid bruits. No rigidity of the neck.  HEART: Regular rate and rhythm. No murmurs, rubs or gallops are appreciated. No displacement of PMI. Positive S3.  LUNGS: Significant crackles in both bases and rales at the level of the right lower middle lung. Positive use of accessory muscles. The patient is on BiPAP. No dullness to percussion.  ABDOMEN: Soft, slightly distended, nontender, nondistended. No hepatosplenomegaly. No masses. Bowel sounds are positive.  GENITAL: Negative for external lesions.  EXTREMITIES: No edema, cyanosis, or clubbing of the upper extremities. Positive edema, pitting +3, on the lower extremities. Signs of peripheral  vascular disease with very thin skin. No hair but no ulcers. Pulses +1. Capillary refill about 3 seconds.  SKIN: No rashes or petechiae.  NEUROLOGIC: Cranial nerves II through XII intact. The patient follows commands. No focal findings.  PSYCHIATRIC: Difficult to evaluate due to BiPAP.  LYMPHATIC: Negative for lymphadenopathy in the neck or supraclavicular areas.  MUSCULOSKELETAL: No joint effusions or joint swelling.   DIAGNOSTIC DATA: Glucose 148. BNP 6700. BUN 23. Creatinine 1.51, which is at baseline for him. Potassium 3.9, sodium 142. GFR around 40%. White count 13.3, hemoglobin 12.5, platelet count 134,000.   Chest x-ray: As mentioned above.   ASSESSMENT AND PLAN: This is a very nice 81 year old gentleman with history of coronary artery disease and congestive heart failure with systolic and diastolic components and pulmonary hypertension who comes today with significant shortness of breath that happened acutely. EKG shows sinus tachycardia with left axis deviation, right bundle branch, and a possible previous anteroseptal infarct. No significant ST depression or elevation. 1.  Acute respiratory failure. The patient comes with significant distress. He was put on BiPAP. The patient was hypoxic requiring several liters of oxygen. He is not on oxygen at home. The etiology of this is secondary to pneumonia and congestive heart failure combined problems.  2.  Acute systolic and diastolic congestive heart failure with pulmonary hypertension. The patient was put on a BiPAP. He is starting to relax, feeling better. Respiratory rate came down from 40 to 20s. He is not tachycardic anymore. The patient has been placed on Lasix. We are going to do 20 mg every 8 hours for now, increase as needed to keep  a good urine output. The patient is edematous. He does not follow a low-salt diet. He does not follow weight checks. He would benefit from education for congestive heart failure. Cardiology consultation.  Echocardiogram to be redone. Continue beta blocker. The patient is not on an ACE inhibitor. His ejection fraction is 40%. He would benefit from starting an ACE inhibitor or an ARB. We are going to start him on lisinopril 5 mg daily.  3.  Chest pain. The patient is admitted with chest pain and positive troponins. At this moment, the troponins could be demand ischemia secondary to the significant congestive heart failure. With this, you know, we have no changes on the EKG that are acute, although he has right bundle branch. We are going to put him on heparin drip, continue Plavix, continue aspirin, continue beta blocker and atorvastatin. The patient came in with severe elevation of blood pressure for what he was put on nitro paste. Continue nitroglycerin.  4.  Pneumonia. The patient has community-acquired pneumonia. No significant risk factors other than being in the hospital in general area for short stay and cardiac catheterization. We are going to just treat him with Levaquin for now, renal dose advised. 5.  Malignant hypertension. The patient with a blood pressure of 216/120, treated with nitro paste. Continue Coreg. Add lisinopril. Continue Imdur as well. 6.  Chronic kidney disease. Stable, stage III. No signs of acute failure.  7.  Hypothyroidism. Continue replacement with thyroid hormone. The patient does not know what dose he is on and actually the wife is not quite sure if he is taking it or not, but apparently he had a complete thyroidectomy. On previous medication list, there is no thyroid replacement for what we are just going to check a TSH and begin from that.  8.  Gastrointestinal prophylaxis with Protonix.  9.  Sleep apnea. Continue BiPAP for now. Use CPAP at night.  10.  Systemic inflammatory response syndrome. This is likely secondary to pneumonia. We will check sputum cultures and blood cultures. The patient is covered with Levaquin. He met criteria based on tachypnea, tachycardia, and  elevation of white blood cells. The patient is afebrile so far.   Since the patient is evaluated for acute coronary syndrome with elevation of troponins, we are going to put him on heparin drip. The patient is a FULL code.   CRITICAL CARE TIME SPENT: About 60 minutes with this patient as he is at risk of intubation, cardiopulmonary collapse, and cardiac arrest.  ____________________________ [place] Sink, MD [id number] D: 10/01/2013 08:20:24 ET T: 10/01/2013 08:39:08 ET JOB#: [id number]  cc: [place] Sink, MD, <Dictator> Nori [place] America Brown MD ELECTRONICALLY SIGNED 10/14/2013 13:47

## 2014-11-09 NOTE — Discharge Summary (Signed)
Dates of Admission and Diagnosis:  Date of Admission 24-Jan-2014   Date of Discharge 28-Jan-2014   Admitting Diagnosis sepsis   Final Diagnosis Sepsis HCAP Ac systolic HF CRF Sleep apnea- On Cpap- Need oXygen as Have episodes of hypoxia at night.    Chief Complaint/History of Present Illness an 81 year old African American male with past medical history significant for ischemic cardiomyopathy, congestive heart failure, systolic dysfunction, ejection fraction of 30%, coronary artery disease as noted by cardiac catheterization in January 2015, CKD stage III, hypothyroidism, chronic anemia, sleep apnea, history of lung cancer, status post lobectomy in the left lung who presents from home secondary to difficulty breathing that worsened last night. The patient is a very poor historian. His wife is at bedside and says that the patient has been having trouble sleeping at night time for the last 3 nights, which sounds more like orthopnea, and he had to sleep in a recliner. Last night, he could not get enough rest at all because he was having extremely troubled breathing and presents to the ER today.  He denies any chest pain currently, but he has had a couple of episodes of chest pains in the last week.  When he had trouble breathing last night, he had his second episode of chest pain, which was mid-sternal and 5/10 non-radiating, pressure-like pain.  Denies any palpitations. No nausea, vomiting, no recent travel. He was admitted here in March 4270 for acute systolic congestive heart failure exacerbation, at which time he intubation. His CODE STATUS at the time was changed to DO NOT RESUSCITATE after discussion with palliative care medicine; however, patient now states that he is a full code and he has reversed his CODE STATUS after seeing his physician recently. At that time, the patient was discharged to a rehabilitation, now currently staying at home and finished his home health services.  He is not on any  home oxygen. His saturations were 95% on room air when he came in initially, but during my examination actually, he started desaturating and his saturations are now 85% on 2 liters nasal cannula and I bumped him up to 6 liters nasal cannula and the ABG is pending at this time.   Allergies:  NKDA: None  Routine Micro:  10-Jul-15 10:19   Micro Text Report BLOOD CULTURE   COMMENT                   NO GROWTH AEROBICALLY/ANAEROBICALLY IN 5 DAYS   ANTIBIOTIC                       Specimen Source #1left hand  Culture Comment NO GROWTH AEROBICALLY/ANAEROBICALLY IN 5 DAYS  Result(s) reported on 30 Jan 2014 at 10:00AM.    10:27   Micro Text Report BLOOD CULTURE   COMMENT                   NO GROWTH AEROBICALLY/ANAEROBICALLY IN 5 DAYS   ANTIBIOTIC                       Specimen Source #2 right hand  Culture Comment NO GROWTH AEROBICALLY/ANAEROBICALLY IN 5 DAYS  Result(s) reported on 30 Jan 2014 at 10:00AM.  Routine Chem:  09-Jul-15 12:29   Glucose, Serum  109  BUN  26  Creatinine (comp)  1.68  Sodium, Serum 141  Potassium, Serum 4.2  Chloride, Serum 102  CO2, Serum 32  Calcium (Total), Serum 9.0  Anion Gap  7  Osmolality (calc) 287  eGFR (African American)  41  eGFR (Non-African American)  36 (eGFR values <76m/min/1.73 m2 may be an indication of chronic kidney disease (CKD). Calculated eGFR is useful in patients with stable renal function. The eGFR calculation will not be reliable in acutely ill patients when serum creatinine is changing rapidly. It is not useful in  patients on dialysis. The eGFR calculation may not be applicable to patients at the low and high extremes of body sizes, pregnant women, and vegetarians.)  Result Comment APTT - GREATER THAN 4 HOURS OLD. LAB TO  - REORDER.  - [place] [address] - 01/24/2014 BY TFK.  Result(s) reported on 24 Jan 2014 at 05:19PM.  B-Type Natriuretic Peptide ([facility]  [phone](Result(s) reported on 24 Jan 2014 at  01:49PM.)  10-Jul-15 03:37   Creatinine (comp)  1.64  11-Jul-15 05:47   Creatinine (comp)  1.73  12-Jul-15 05:33   Creatinine (comp)  1.85  13-Jul-15 05:51   Glucose, Serum 82  BUN  23  Creatinine (comp)  1.86  Sodium, Serum 141  Potassium, Serum  3.3  Chloride, Serum 102  CO2, Serum 32  Calcium (Total), Serum 8.6  Anion Gap 7  Osmolality (calc) 284  eGFR (African American)  37  eGFR (Non-African American)  32 (eGFR values <619mmin/1.73 m2 may be an indication of chronic kidney disease (CKD). Calculated eGFR is useful in patients with stable renal function. The eGFR calculation will not be reliable in acutely ill patients when serum creatinine is changing rapidly. It is not useful in  patients on dialysis. The eGFR calculation may not be applicable to patients at the low and high extremes of body sizes, pregnant women, and vegetarians.)  Cardiac:  09-Jul-15 12:29   CK, Total 59 (39-308 NOTE: NEW REFERENCE RANGE  08/20/2013)  CPK-MB, Serum 1.6 (Result(s) reported on 24 Jan 2014 at 04:52PM.)  Troponin I  0.13 (0.00-0.05 0.05 ng/mL or less: NEGATIVE  Repeat testing in 3-6 hrs  if clinically indicated. >0.05 ng/mL: POTENTIAL  MYOCARDIAL INJURY. Repeat  testing in 3-6 hrs if  clinically indicated. NOTE: An increase or decrease  of 30% or more on serial  testing suggests a  clinically important change)    18:57   Troponin I  0.17 (0.00-0.05 0.05 ng/mL or less: NEGATIVE  Repeat testing in 3-6 hrs  if clinically indicated. >0.05 ng/mL: POTENTIAL  MYOCARDIAL INJURY. Repeat  testing in 3-6 hrs if  clinically indicated. NOTE: An increase or decrease  of 30% or more on serial  testing suggests a  clinically important change)    21:05   Troponin I  0.28 (0.00-0.05 0.05 ng/mL or less: NEGATIVE  Repeat testing in 3-6 hrs  if clinically indicated. >0.05 ng/mL: POTENTIAL  MYOCARDIAL INJURY. Repeat  testing in 3-6 hrs if  clinically indicated. NOTE: An increase or  decrease  of 30% or more on serial  testing suggests a  clinically important change)  Routine Coag:  09-Jul-15 12:29   Activated PTT (APTT) - (A HCT value >55% may artifactually increase the APTT. In one study, the increase was an average of 19%. Reference: "Effect on Routine and Special Coagulation Testing Values of Citrate Anticoagulant Adjustment in Patients with High HCT Values." American Journal of Clinical Pathology 2006;126:400-405.)  Prothrombin  17.2  INR 1.4 (INR reference interval applies to patients on anticoagulant therapy. A single INR therapeutic range for coumarins is not optimal for all indications; however, the suggested range for  most indications is 2.0 - 3.0. Exceptions to the INR Reference Range may include: Prosthetic heart valves, acute myocardial infarction, prevention of myocardial infarction, and combinations of aspirin and anticoagulant. The need for a higher or lower target INR must be assessed individually. Reference: The Pharmacology and Management of the Vitamin K  antagonists: the seventh ACCP Conference on Antithrombotic and Thrombolytic Therapy. WUJWJ.1914 Sept:126 (3suppl): [id number]. A HCT value >55% may artifactually increase the PT.  In one study,  the increase was an average of 25%. Reference:  "Effect on Routine and Special Coagulation Testing Values of Citrate Anticoagulant Adjustment in Patients with High HCT Values." American Journal of Clinical Pathology 2006;126:400-405.)  Routine Hem:  09-Jul-15 12:29   WBC (CBC) 9.2  RBC (CBC)  3.81  Hemoglobin (CBC)  11.3  Hematocrit (CBC)  35.1  Platelet Count (CBC)  114 (Result(s) reported on 24 Jan 2014 at 01:04PM.)  MCV 92  MCH 29.7  MCHC 32.2  RDW  19.6  13-Jul-15 05:51   WBC (CBC) 7.1  RBC (CBC)  3.20  Hemoglobin (CBC)  9.4  Hematocrit (CBC)  29.3  Platelet Count (CBC)  101  MCV 92  MCH 29.4  MCHC 32.1  RDW  19.5  Neutrophil % 65.9  Lymphocyte % 18.2  Monocyte % 9.7  Eosinophil  % 5.6  Basophil % 0.6  Neutrophil # 4.7  Lymphocyte # 1.3  Monocyte # 0.7  Eosinophil # 0.4  Basophil # 0.0 (Result(s) reported on 28 Jan 2014 at 06:03AM.)   PERTINENT RADIOLOGY STUDIES: XRay:    09-Jul-15 18:16, Chest Portable Single View  Chest Portable Single View   REASON FOR EXAM:    check central line placement  COMMENTS:       PROCEDURE: DXR - DXR PORTABLE CHEST SINGLE VIEW  - Jan 24 2014  6:16PM     CLINICAL DATA:  Line placement.    EXAM:  PORTABLE CHEST - 1 VIEW    COMPARISON:  CT 01/24/2014.  Chest x-ray07/03/2014.    FINDINGS:  Right IJ line in good anatomic position. Tip at the cavoatrial  junction. Mediastinum and hilar structures are normal. Bibasilar  infiltrates with a prominent left effusion noted. No pneumothorax.  Cardiomegaly with normal pulmonary vascularity. No acute bony  abnormality. No acute abnormality     IMPRESSION:  1. Right IJ line in good anatomic position.  2. Bibasilar prominent infiltrates consistent with pneumonia. Left  pleural effusion.  3. Cardiomegaly.      Electronically Signed    By: Marcello Moores  Register    On: 01/24/2014 18:25     Verified By: Osa Craver, M.D., MD  [place]:    12-Jul-15 16:52, [place] Color Flow Doppler Low Extrem Bilat (Legs)  [place] Color Flow Doppler Low Extrem Bilat (Legs)   REASON FOR EXAM:    DVT? SHort of breath  COMMENTS:       PROCEDURE: [place]  - US DOPPLER LOW EXTR BILATERAL  - Jan 27 2014  4:52PM     CLINICAL DATA:  Difficulty breathing with intermediate probability  lung scan for pulmonary embolus    EXAM:  BILATERAL LOWER EXTREMITY VENOUS DOPPLER ULTRASOUND    TECHNIQUE:  Gray-scale sonography with graded compression, as well as color  Doppler and duplex ultrasound were performed to evaluate the lower  extremity deep venous systems from the level of the common femoral  vein and including the common femoral, femoral, profunda femoral,  popliteal and calf veins including the posterior tibial,  peroneal  and gastrocnemius veins when visible. The superficial great  saphenous vein was also interrogated. Spectral Doppler was utilized  to evaluate flow at rest and with distal augmentation maneuvers in  the common femoral, femoral and popliteal veins.    COMPARISON:  Ventilation and perfusion lung scan January 25, 2014    FINDINGS:  Flow in the venous structures ofboth lower extremities is  spontaneous and phasic in all segments. There is normal compression  and augmentation of the venous structures of both lower extremities.  Venous Doppler signal is normal in all regions bilaterally. There is  no thrombus inthe deeper visualized superficial venous structures  on either side. There is no deep venous competence in the lower  extremity.     IMPRESSION:  No evidence of deep venous thrombosis in either lower extremity.      Electronically Signed    By: Lowella Grip M.D.    On: 01/27/2014 17:15         Verified By: Leafy Kindle. WOODRUFF, M.D.,  [place]:    09-Jul-15 18:16, Chest Portable Single View  PACS Image     12-Jul-15 16:52, [place] Color Flow Doppler Low Extrem Bilat (Legs)  PACS Image   Nuclear Med:    10-Jul-15 12:33, Lung VQ Scan - Nuc Med  Lung VQ Scan - Nuc Med   REASON FOR EXAM:    hypoxia  COMMENTS:       PROCEDURE: NM  - NM VQ LUNG SCAN  - Jan 25 2014 12:33PM     CLINICAL DATA:  Hypoxia    EXAM:  NUCLEAR MEDICINE VENTILATION - PERFUSION LUNG SCAN    Views: Anterior, posterior, left lateral, right lateral, RPO, LPO,  RAO, LAO -ventilation perfusion    Radionuclide: Technetium 32mDTPA -ventilation ; Technetium 977mmacroaggregated albumin- perfusion  Dose:  36.17 mCi-ventilation; 4.284 mCi-perfusion    Route of administration: Inhalation-ventilation; intravenous  -perfusion    COMPARISON:  Chest radiograph January 24, 2014 and chest CT January 24, 2014    FINDINGS:  On the ventilation study, there is decreased ventilation in the left  lower lobe in an  area of consolidation noted on imaging studies.  There is mild decreased ventilation in the right base, also in areas  of effusion and consolidation noted on imaging studies. There is a  modest ventilation defect in the posterior segment of the right  upper lobe in an area of infiltrate. Elsewhere, ventilation appears  unremarkable.  On the perfusion study, there are matching defects in the left lower  lobe. There is an area of decreased radiotracer uptake in the  posterior segment of the right upper lobe which appears  disproportionate to the ventilation defect. No other segmental  ventilation/perfusion mismatch is seen.     IMPRESSION:  There is extensive infiltrate bilaterally on imaging studies. There  is a single segmental level perfusion defect in the posterior  segment right upper lobe whichappears more notable than the  corresponding ventilation defect. There are matching ventilation and  perfusion defects in the left base. This combination of findings  constitutes an intermediate probability for pulmonary embolus.    Electronically Signed    By: WiLowella Grip.D.    On: 01/25/2014 13:05         Verified By: WILeafy KindleWOODRUFF, M.D.,   Pertinent Past History:  Pertinent Past History 1.  Ischemic cardiomyopathy.  2.  Coronary artery disease, status post cardiac catheterization in January 2015, showing 100% RCA blockage with collaterals.  Medical management recommended and 70% proximal RCA blockage, 50% season proximal LAD.  3.  Congestive heart failure with both systolic and diastolic dysfunction, ejection fraction of 30%.  4.  Obstructive sleep apnea.  5.  Benign prostatic hypertrophy.  6.  Hypothyroidism.  7.  Anemia of chronic disease.  8.  Osteoarthritis.  9.  Pulmonary hypertension.  10.  History of non-small cell lung cancer, status post lobectomy of left lung.  11.  Peripheral vascular disease.  12.  Gastroesophageal reflux disease.  13.  History of  dysphagia. We will try scope aspiration.  14.  CKD stage III.   Hospital Course:  Hospital Course 81 year old male with history of systolic congestive heart failure, ejection fraction of 30% last hospitalization actually requiring intubation for congestive heart failure exacerbation, hypertension, chronic kidney disease, coronary artery disease, and anemia brought in secondary to dyspnea and orthopnea has h/o aspiration, now noted to be hypoxic.   *Acute hypoxic respiratory failure.  -clinically acute on chronic congestive heart failure exacerbation , CT - acute infiltrate extensive on right middle and lower lobe consistent with PNA/ h/o aspiration is consistent. Improved now, on room air, but reviewed report from Dr. Bethanne Ginger office of sleep study- Pt have Hypoxia <88% > 5 min in night- so will prescribe oxygen at discharge.  *Intermediate V/Q scan, hypoxia is more likely due to aspiration.    pt was on heparin drip- but due to continued bleed from IJ line site.- stopped heparin,Spoke to Dr. Raul Del- negative DVT studies.   no need for long term anticoagulation.  *HCAP vs aspiration:  blood cultures- negative / sputum cultures- not done. abx zosyn / azithro and vancomycin. ( 4th day on IV ) aspiration precautions speech evaluation done. Improved- swich to oral Abx.   *Sepsis: evidenced by tachycardia / tachypnea/ hypoxia/ present on admission  afebrile but WBC increased + infiltrate on CT scan. resolved now.  *acute combined heart failure mild exacerbation. cont lasix as BNP was elevated . ef 30%  bb/acei cardilogy appreciate Dr. Clayborn Bigness.  *Elevated troponin, mild elevation secondary to demand ischemia from sepsis -coronary artery disease with his cardiac catheterization revealing 100% RCA blockage, and significant proximal RCA blockage in January 2015.  -IV heparin for 48 hrs- stopped now due to bleed around IJ line insertion site.- No further cardiac interventions this time, aspirin,  Plavix, and statin  *Chronic kidney disease, stage III, stable at this time.   *Anemia of chronic disease, appears stable.   Condition on Discharge Stable   Code Status:  Code Status Full Code   DISCHARGE INSTRUCTIONS HOME MEDS:  Medication Reconciliation: Patient's Home Medications at Discharge:     Medication Instructions  atorvastatin 10 mg oral tablet  1 tab(s) orally once a day   plavix 75 mg oral tablet  1 tab(s) orally once a day   acetaminophen-hydrocodone 325 mg-5 mg oral tablet  1 tab(s) orally every 6 hours, As Needed- for Pain    ferrous sulfate 325 mg (65 mg elemental iron) oral tablet  1 tab(s) orally 2 times a day   finasteride 5 mg oral tablet  1 tab(s) orally once a day (at bedtime)   flomax 0.4 mg oral capsule  1 cap(s) orally once a day   isosorbide mononitrate 60 mg oral tablet, extended release  2 tab(s) orally once a day (in the morning)   colcrys 0.6 mg oral tablet  1 tab(s) orally once a day, As Needed   systane preserved ophthalmic solution  1 drop(s)  to each affected eye 2 times a day, As Needed   allopurinol 100 mg oral tablet  1.5 tab(s) orally once a day   ascriptin buffered 325 mg oral tablet  1 tab(s) orally once a day (at bedtime)   carvedilol 6.25 mg oral tablet  0.5 tab(s) orally 2 times a day   furosemide 80 mg oral tablet  1 tab(s) orally once a day   augmentin 400 mg-57 mg/5 ml oral powder for reconstitution  10 milliliter(s) orally every 12 hours x 4 days   ensure plus *  237 milliliter(s) orally 3 times a day (with meals)     Physician's Instructions:  Home Health? Yes   [facility]  Nurse  Nurse Aid   Home Oxygen? Yes   Oxygen delivery at home: 2L  Nasal Cannula   Diet Low Sodium   Dietary Supplements Ensure   Dietary Supplements Frequency Three times per day   Activity Limitations As tolerated   Return to Work Not Applicable   Time frame for Follow Up Appointment 1-2 weeks  PMD   Other Comments  Follow with Dr. Raul Del in office in 1 week.     Wallene Huh E(Consultant): [facility] Department of Pulmonology, [address], [place], [place] [address], [place]   Kary Kos, Jim(Family Physician): [facility], [address], [place], [place] [address], [place] [phone]  Electronic Signatures: Vaughan Basta (MD)  (Signed 16-Jul-15 13:07)  Authored: ADMISSION DATE AND DIAGNOSIS, CHIEF COMPLAINT/HPI, Allergies, PERTINENT LABS, PERTINENT RADIOLOGY STUDIES, PERTINENT PAST HISTORY, HOSPITAL COURSE, [place], PATIENT INSTRUCTIONS, Follow Up Physician   Last Updated: 16-Jul-15 13:07 by Vaughan Basta (MD)

## 2014-11-09 NOTE — Discharge Summary (Signed)
Dates of Admission and Diagnosis:  Date of Admission 15-Aug-2013   Date of Discharge 16-Aug-2013   Admitting Diagnosis congestive heart failure, acute on chronic systollic   Final Diagnosis consgestive heart failure, acute on chronic systollic   Discharge Diagnosis 1 cad   2 hypertension   3 pulmonary hypertension   4 chronic kidney disease    Chief Complaint/History of Present Illness Pt developed hypoxia and shortness of breath post cardiac cath. Felt to be acute onchornic congestive heart fialure due to contrast. Plasced in observation for monitored diuresis with iv lasix.   PERTINENT RADIOLOGY STUDIES: XRay:    28-Jan-15 15:20, Chest Portable Single View  Chest Portable Single View   REASON FOR EXAM:    CHF  COMMENTS:       PROCEDURE: DXR - DXR PORTABLE CHEST SINGLE VIEW  - Aug 15 2013  3:20PM     CLINICAL DATA:  CHF    EXAM:  PORTABLE CHEST - 1 VIEW    COMPARISON:  DG CHEST 2V dated 10/21/2010    FINDINGS:  Shallow inspiration. The cardiac silhouette is enlarged. Technique  taken into consideration is prominence of the interstitial markings,  indistinctness of the pulmonary vasculature, and areas of  peribronchial cuffing. An area of increased density with a  consolidative component along the base is identified within the left  hemithorax. This area tracks along the left lateral chest wall.  Degenerative changes identified involving the right and left  shoulders.     IMPRESSION:  1. Interstitial infiltrate consistent with pulmonary edema  2. Asymmetric edema versus focal nonedematous infiltrate with a  concomitant effusion left lung base.      Electronically Signed    By: Margaree Mackintosh M.D.    On: 08/15/2013 15:33         Verified By: Mikki Santee, M.D., [place] with iv diuresis. Oxygen saturation with room air was greater than 90%. No further shortness of breath. Cath site clean and dry. Had a  mechanical fall with no evidence of injury. Hemodynamically stable.  CHF has resolved. Not on ace i due to renal insuffiency. Now on carvedilol rather than metoprolol   Condition on Discharge Satisfactory   DISCHARGE INSTRUCTIONS HOME MEDS:  Medication Reconciliation: Patient's Home Medications at Discharge:     Medication Instructions  isosorbide mononitrate 60 mg oral tablet, extended release  1 tab(s) orally once a day (in the morning)   atorvastatin 10 mg oral tablet  1 tab(s) orally once a day   plavix 75 mg oral tablet  1 tab(s) orally once a day   acetaminophen-hydrocodone 325 mg-5 mg oral tablet  1 tab(s) orally every 6 hours, As Needed- for Pain    ferrous sulfate 325 mg (65 mg elemental iron) oral tablet  1 tab(s) orally 2 times a day   finasteride 5 mg oral tablet  1 tab(s) orally once a day (at bedtime)   aspirin 325 mg oral tablet  tab(s) orally once a day (at bedtime)   prednisone 5 mg oral tablet  1 tab(s) orally once a day, As Needed   systane preservative-free ophthalmic solution  drop(s) to each affected eye , As Needed   furosemide 80 mg oral tablet  1 tab(s) orally once a day   lotrisone 0.05%-1% topical cream  Apply topically to affected area 2 times a day, As Needed - for Itching   dexilant 60 mg oral delayed release capsule  1 cap(s)  orally once a day   flonase 50 mcg/inh nasal spray  1 spray(s) nasal once a day, As Needed for allergies   flomax 0.4 mg oral capsule  1 cap(s) orally once a day   carvedilol 6.25 mg oral tablet  1 tab(s) orally 2 times a day    PRESCRIPTIONS: PRINTED AND GIVEN TO PATIENT/FAMILY  STOP TAKING THE FOLLOWING MEDICATION(S):    allopurinol 100 mg oral tablet: 2.5 tab(s) orally once a day metoprolol tartrate 50 mg oral tablet: 1.5 tab(s) orally 2 times a day colcrys 0.6 mg oral tablet: 1 tab(s) orally once a day nitrostat: 0.4 milligram(s) orally , As Needed- for Chest Pain   Physician's Instructions:  Home Health? No   Treatments  None   Dressing Care Remove dressing tomorrow.  May shower.   Home Oxygen? No   Diet Low Sodium  Low Fat, Low Cholesterol   Diet Consistency Regular Consistency   Activity Limitations No exertional activity  No heavy lifting   Return to Work Not Applicable   Time frame for Follow Up Appointment 5-7 days   Electronic Signatures: Teodoro Spray (MD)  (Signed 29-Jan-15 12:54)  Authored: ADMISSION DATE AND DIAGNOSIS, CHIEF COMPLAINT/HPI, [place], PATIENT INSTRUCTIONS   Last Updated: 29-Jan-15 12:54 by Teodoro Spray (MD)

## 2014-11-09 NOTE — Discharge Summary (Signed)
PATIENT NAME:  Mathew Morris, Mathew Morris MR#:  [id number] DATE OF BIRTH:  January 07, 1924  DATE OF ADMISSION:  10/01/2013 DATE OF DISCHARGE:  10/10/2013  DISCHARGE DIAGNOSES:  1.  Acute respiratory failure, likely secondary to acute systolic and diastolic heart failure and pneumonia, requiring intubation for a short time, was extubated on the 19th of March after intubation on the 16th of March.  2.  Acute systolic and diastolic heart failure with EF of 30% well compensated at this time.  3.  Pneumonia, improving with antibiotics.  4.  Elevated troponin, likely due to supply/demand ischemia. No myocardial infarction.  5.  Malignant hypertension, now resolved.  6.  Chronic kidney disease, stage III, now at baseline. 7.  Severe sepsis, present on admission, now resolved.  8.  Dysphagia with high risk for aspiration per speech therapy with high risk for pneumonias also.  9.  Constipation, on as-needed stool softener medications.   SECONDARY DIAGNOSES:  1.  Coronary artery disease.  2.  Chronic kidney disease, stage III. 3.  Hypothyroidism. 4.  Benign prostatic hypertrophy.  5.  Anemia of chronic disease.  6.  Osteoarthritis.  7.  Obesity.  8.  Obstructive sleep apnea.  9.  Pulmonary hypertension.  10.  History of non-small cell lung cancer, status post lobectomy in the left lung.  11.  Peripheral vascular disease.  12.  Gastroesophageal reflux disease.   CONSULTATIONS:  1.  Cardiology, Dr. Saralyn Pilar.  2.  Pulmonary, Dr. Mortimer Fries. 3.  Speech therapy.  4.  Palliative care.   PROCEDURES AND RADIOLOGY:  1.  Modified barium swallow by speech therapy.  2.  Endotracheal intubation from the 16th of March until the 19th of March when the patient was extubated.  3.  Chest x-ray on the 16th of March at 5:04 showed possible pneumonia. Stable cardiomegaly.  4.  Chest x-ray on the 16th of March at 12:59 showed interval placement of endotracheal tube. Left lower lobe infiltrate. Persistent left pleural effusion with  loculated pleural fluid versus pleural mass in the peripheral left mid lung.  5.  Chest x-ray on 17th of March showed slightly better aeration of the right lung base. Persistent bibasilar opacities, left greater than right.  6.  KUB on the 17th of March showed NG tube terminating near the gastroesophageal junction. Advancing approximately 10 cm was recommended.  7.  Chest x-ray on 18th of March showed stable loculated left-sided pleural effusion with left basilar atelectasis or infiltrate. Hazy right basilar atelectasis or infiltrate. Endotracheal tube in place with tip 6 cm above the carina. NG tube in place.  8.  Repeat chest x-ray on 18th of March at 10:09 showed endotracheal tube deep, lying 4.4 cm above the carina. Persistent density at both lung bases. Pneumonia at both lung bases. Advancement of tube by 10 cm recommended.  9.  Chest x-ray on 18th of March at 3:00 in the afternoon showed central venous catheter entering the SVC with the tip not well visualized. No pneumothorax.  10.  Chest x-ray on 19th of March showed possible pneumonia. Endotracheal tube and central venous line unchanged in the position.  11.  Ultrasound-guided thoracentesis on 18th of March showed mild right pleural effusion with large amount of atelectatic lung.  12.  Left-sided ultrasound-guided thoracentesis on 18th of March showed no definite pleural effusion, so thoracentesis could not be performed.  13.  CT scan of the chest without contrast on 17th of March showed bilateral pleural effusions, small and partially loculated on the left. Moderate and  free-flowing on the right. Possible bilateral lower lobe pneumonia.  14.  A 2-D echocardiogram on 16th of March showed LVEF of 30% to 35%. Moderately decreased global LV systolic function. Impaired relaxation of LV diastolic filling. Moderate LVH. Moderate mitral valve regurgitation. Mild aortic regurgitation. Moderate to severe tricuspid regurgitation. Severely elevated pulmonary  artery systolic pressure. Severely increased LV posterior wall thickness.   MAJOR LABORATORY PANEL:  1.  Blood cultures x2 were negative on the 16th of March.   2.  Sputum culture grew light growth of Candida albicans and rare E. coli.  3.  Procalcitonin was within normal limits with a value of 0.11.   HISTORY AND SHORT HOSPITAL COURSE: The patient is an 81 year old male with above-mentioned medical problems who was admitted for acute respiratory failure thought to be secondary to pneumonia and/or CHF exacerbation. Please see Dr. Olene Craven dictated history and physical for further details. The patient underwent interval intubation and was evaluated by pulmonary, Dr. Mortimer Fries, and was continued on diuresis and IV antibiotics and was improving and was subsequently extubated on 19th of March and subsequently had been transferred to floor and has been slowly improving, although not very well. He was found to have significant dysphagia, for which he was evaluated by speech therapy and was recommended modified barium swallow, which showed significant amount of inspiration with a large odontoid process encroaching on his esophageal tube, likely making this difficult. He also had some minimal troponin elevation which was thought to be due to supply/demand ischemia. His malignant hypertension was resolved with adjustment in the blood pressure medication.  He was also found to have severe sepsis on admission which was resolved with treatment and was feeling much better overall and is being discharged to a skilled nursing facility in stable condition.   CODE STATUS: HE WAS EVALUATED BY PALLIATIVE CARE IN THE HOSPITAL AND WAS MADE DNR, AND IF HE FAILS HIS REHAB, HE MAY NEED TO CONSIDER HOSPICE AT THAT POINT, AND FAMILY WAS WELL AWARE HE REMAINS AT VERY HIGH RISK FOR RECURRENT ASPIRATION, RECURRENT PNEUMONIA, AND RECURRENT HOSPITALIZATION FOR THE SAME ETIOLOGY.   PHYSICAL EXAMINATION:  VITAL SIGNS: On the date of  discharge, temperature 98, heart rate 80 per minute, respirations 20 per minute, blood pressure 100/72 mmHg. He was saturating 96% on room air.  CARDIOVASCULAR: S1, S2 normal. No murmurs, rubs, or gallops.  LUNGS: Clear to auscultation bilaterally. No wheezing, rales, rhonchi, or crepitation.  ABDOMEN: Soft, benign.  NEUROLOGIC: Nonfocal examination.   All other physical examination remained at baseline.   DISCHARGE MEDICATIONS:  1.  Atorvastatin 10 mg p.o. daily.  2.  Plavix 75 mg p.o. daily.  3.  Acetaminophen hydrocodone 325/5 mg one tablet every 6 hours as needed.  4.  Iron sulfate 325 mg p.o. b.i.d.  5.  Finasteride 5 mg p.o. at bedtime.  6.  Prednisone 5 mg p.o. daily as needed.  7.  Lotrisone 0.05%/1%  topical cream to affected area twice a day as needed.  8.  Dexilant 60 mg p.o. daily.  9.  Flomax 0.4 mg p.o. daily.  10.  Coreg 6.25 mg p.o. b.i.d.  11.  Lasix 80 mg one and a half tablet p.o. daily.  12.  Isosorbide mononitrate 60 mg two tablets p.o. daily.  13.  Nitroglycerin 0.4 mg sublingual as needed.  14.  Systane eye drops to each affected eye twice a day as needed.  15.  Allopurinol 100 mg one and a half tablets p.o. daily.  16.  Colcrys 0.6 mg p.o. daily as needed.  17.  Aspirin 325 mg p.o.  18.  Lisinopril 2.5 mg p.o. daily.   DIET: Low sodium.  ACTIVITY: As tolerated.   DISCHARGE INSTRUCTIONS AND FOLLOW-UP: The patient was instructed to follow up with his primary care physician, Dr. Jarome Lamas in 1 to 2 weeks.   He will get physical therapy evaluation and management while at the facility.   TOTAL TIME SPENT DISCHARGING THIS PATIENT: 55 minutes.  ____________________________ Lucina Mellow. Manuella Ghazi, MD vss:np D: 10/10/2013 15:48:20 ET T: 10/10/2013 17:22:54 ET JOB#: [id number]  cc: Shamariah Shewmake S. Manuella Ghazi, MD, <Dictator> Irven Easterly. Kary Kos, MD Corey Skains, MD Mariane Duval, MD Efraim Kaufmann, MD Lucina Mellow [facility] MD ELECTRONICALLY SIGNED 10/12/2013 19:27

## 2014-11-12 DIAGNOSIS — I951 Orthostatic hypotension: Secondary | ICD-10-CM

## 2014-11-12 DIAGNOSIS — I5022 Chronic systolic (congestive) heart failure: Secondary | ICD-10-CM | POA: Insufficient documentation

## 2014-11-12 DIAGNOSIS — R1310 Dysphagia, unspecified: Secondary | ICD-10-CM

## 2014-11-17 NOTE — Consult Note (Signed)
Has oral thrush. Started nystatin s/s and is already feeling better. EGD with PEG scheduled for Monday. I am afraid that if he gets discharged over the weekend, he will get dehydrated again. Tube feedings can start on Tues AM. In his case, should consider adding water several times daily through the G tube so that he does not have drink as much fluid on his own by mouth. thanks  Electronic Signatures: Verdie Shire (MD)  (Signed on 07-Apr-16 14:28)  Authored  Last Updated: 07-Apr-16 14:28 by Verdie Shire (MD)

## 2014-11-17 NOTE — Consult Note (Signed)
Present Illness 81 year old male with history of coronary artery disease status post cardiac catheterization done in January of 2014, which showed 100% RCA with EF of 30% and blockage with collaterals. Medical management was recommended at that time.  he also has a history of  systolic heart failure with reduced ejection fraction of 30%.  He also has a history of non-small cell carcinoma of the lung treated with resection in the past.  He has a history of chronic kidney disease.  He was admitted with  hypernatremia, dehydration and dysphagia.  He was recently in the hospital for dysphagia and underwent balloon dilatation of his esophagus.  He returned home but has difficulty being able to swallow eat any food.  He presents with complaints of dysphagia.  He has no chest pain.  His electrocardiogram is unchanged from his baseline.  In the emergency room he had a serum troponin drawn which was 2.27.  He again has no chest pain or change in his angina pattern.  His serum sodium was markedly elevated.  He had acute on chronic renal insufficiency.  Consideration for a PEG tube has been made.  Patient is unable to eat or drink much orally.   Physical Exam:  GEN chronically ill-appearing   HEENT PERRL   NECK No masses  dysphagia   RESP no use of accessory muscles   CARD Regular rate and rhythm  Normal, S1, S2   ABD denies tenderness  dysphagia   LYMPH negative neck, negative axillae   EXTR negative cyanosis/clubbing, negative edema   SKIN normal to palpation   NEURO cranial nerves intact, motor/sensory function intact   PSYCH A+O to time, place, person   Review of Systems:  Subjective/Chief Complaint pain in his throat when swallowing weakness and fatigue no chest pain   General: Weight loss or gain  Fatigue  Weakness   Skin: No Complaints   ENT: Sore Throat  Dysphagia   Eyes: No Complaints   Neck: No Complaints   Respiratory: No Complaints   Cardiovascular: No Complaints    Gastrointestinal: No Complaints   Genitourinary: No Complaints   Vascular: No Complaints   Musculoskeletal: No Complaints   Neurologic: No Complaints   Hematologic: No Complaints   Endocrine: No Complaints   Psychiatric: No Complaints   Review of Systems: All other systems were reviewed and found to be negative   Medications/Allergies Reviewed Medications/Allergies reviewed   Family & Social History:  Family and Social History:  Family History Non-Contributory   Social History negative tobacco, negative ETOH, negative Illicit drugs   Place of Living Home   EKG:  Abnormal NSSTTW changes  RBBB   Interpretation sinus tachycardia with right bundle-branch block ; no ischemia    Bactrim: Rash  Prevpac: Unknown   Impression 81 year old male with history of coronary disease treated medically, chronic kidney disease, recent problems with dysphagia who was recently admitted with dysphagia and esophageal balloon dilatation.  He now returns with inability eat due to pain in his throat.  He has no exertional symptoms no chest pain.  He was noted to be significantly dehydrated with hypernatremia.  He was noted incidentally to have an elevated serum troponin.  Has no acute ischemic changes on his electrocardiogram.  This is likely demand ischemia secondary to renal insufficiency, dehydration.  Patient is not a candidate for cardiac catheterization.  Would recommend proceeding with PEG tube as planned.  Will discontinue Plavix.  Would remain on aspirin and last this is contraindicated as well.  Plan 1. Will discontinue Plavix and continue aspirin unless this knees to be discontinued for PEG tube 2. continue with the remainder of his medications 3. IV hydration 4. Would not proceed with invasive cardiac evaluation at this time as patient has acute on chronic renal insufficiency. 5. Will follow as needed.   Electronic Signatures: Teodoro Spray (MD)  (Signed 06-Apr-16  12:47)  Authored: General Aspect/Present Illness, History and Physical Exam, Review of System, Family & Social History, EKG , Allergies, Impression/Plan   Last Updated: 06-Apr-16 12:47 by Teodoro Spray (MD)

## 2014-11-17 NOTE — Consult Note (Signed)
Chief Complaint:  Subjective/Chief Complaint Able to eat little better since patient being treated for oral thrush. Thrush not seen during previous EGD last month. Still with burning on urination.   VITAL SIGNS/ANCILLARY NOTES: **Vital Signs.:   09-Apr-16 08:46  Vital Signs Type Pre Medication  Pulse Pulse 82  Systolic BP Systolic BP 784  Diastolic BP (mmHg) Diastolic BP (mmHg) 77  Mean BP 91   Brief Assessment:  GEN no acute distress   Cardiac Regular   Respiratory clear BS   Gastrointestinal Normal   Lab Results: Hepatic:  07-Apr-16 04:17   Bilirubin, Total 0.4 (0.3-1.2 NOTE: New Reference Range  09/24/14)  Alkaline Phosphatase 69 (38-126 NOTE: New Reference Range  09/24/14)  SGPT (ALT) 28 (17-63 NOTE: New Reference Range  09/24/14)  SGOT (AST) 22 (15-41 NOTE: New Reference Range  09/24/14)  Total Protein, Serum  5.8 (6.5-8.1 NOTE: New Reference Range  09/24/14)  Albumin, Serum  2.3 (3.5-5.0 NOTE: New reference range  09/24/14)  Routine Chem:  07-Apr-16 04:17   BUN  46 (6-20 NOTE: New Reference Range  09/24/14)  Creatinine (comp)  1.59 (0.61-1.24 NOTE: New Reference Range  09/24/14)  Sodium, Serum  146 (135-145 NOTE: New Reference Range  09/24/14)  Potassium, Serum  3.4 (3.5-5.1 NOTE: New Reference Range  09/24/14)  Chloride, Serum 110 (101-111 NOTE: New Reference Range  09/24/14)  CO2, Serum 28 (22-32 NOTE: New Reference Range  09/24/14)  Calcium (Total), Serum  8.3 (8.9-10.3 NOTE: New Reference Range  09/24/14)  Anion Gap 8  eGFR (African American)  44  eGFR (Non-African American)  38 (eGFR values <90m/min/1.73 m2 may be an indication of chronic kidney disease (CKD). Calculated eGFR is useful in patients with stable renal function. The eGFR calculation will not be reliable in acutely ill patients when serum creatinine is changing rapidly. It is not useful in patients on dialysis. The eGFR calculation may not be applicable to patients at  the low and high extremes of body sizes, pregnant women, and vegetarians.)  Magnesium, Serum 1.7 (1.7-2.4 THERAPEUTIC RANGE: 4-7 mg/dL TOXIC: > 10 mg/dL  ----------------------- NOTE: New Reference Range  09/24/14)  09-Apr-16 04:53   Glucose, Serum  112 (65-99 NOTE: New Reference Range  09/24/14)  BUN  38 (6-20 NOTE: New Reference Range  09/24/14)  Creatinine (comp)  1.52 (0.61-1.24 NOTE: New Reference Range  09/24/14)  Sodium, Serum 142 (135-145 NOTE: New Reference Range  09/24/14)  Potassium, Serum 4.0 (3.5-5.1 NOTE: New Reference Range  09/24/14)  Chloride, Serum 109 (101-111 NOTE: New Reference Range  09/24/14)  CO2, Serum 28 (22-32 NOTE: New Reference Range  09/24/14)  Calcium (Total), Serum  7.8 (8.9-10.3 NOTE: New Reference Range  09/24/14)  Anion Gap  5  eGFR (African American)  46  eGFR (Non-African American)  40 (eGFR values <650mmin/1.73 m2 may be an indication of chronic kidney disease (CKD). Calculated eGFR is useful in patients with stable renal function. The eGFR calculation will not be reliable in acutely ill patients when serum creatinine is changing rapidly. It is not useful in patients on dialysis. The eGFR calculation may not be applicable to patients at the low and high extremes of body sizes, pregnant women, and vegetarians.)  Routine Hem:  07-Apr-16 04:17   WBC (CBC)  10.7  RBC (CBC)  3.29  Hemoglobin (CBC)  9.9  Hematocrit (CBC)  31.4  Platelet Count (CBC)  89  MCV 96  MCH 30.2  MCHC  31.6  RDW  18.5  Neutrophil % 82.9  Lymphocyte % 8.5  Monocyte % 7.5  Eosinophil % 1.0  Basophil % 0.1  Neutrophil #  8.8  Lymphocyte #  0.9  Monocyte # 0.8  Eosinophil # 0.1  Basophil # 0.0 (Result(s) reported on 24 Oct 2014 at 05:03AM.)  09-Apr-16 04:53   WBC (CBC) 8.9  RBC (CBC)  3.30  Hemoglobin (CBC)  10.0  Hematocrit (CBC)  31.3  Platelet Count (CBC)  93  MCV 95  MCH 30.2  MCHC  31.9  RDW  18.4  Neutrophil % 80.4  Lymphocyte % 9.9   Monocyte % 7.6  Eosinophil % 1.9  Basophil % 0.2  Neutrophil #  7.1  Lymphocyte #  0.9  Monocyte # 0.7  Eosinophil # 0.2  Basophil # 0.0 (Result(s) reported on 26 Oct 2014 at 05:33AM.)   Assessment/Plan:  Assessment/Plan:  Assessment Malnutrition, renal failure, dehydration, dysphagia.   Plan On for EGD/PEG on Monday.   Electronic Signatures: Verdie Shire (MD)  (Signed 09-Apr-16 10:16)  Authored: Chief Complaint, VITAL SIGNS/ANCILLARY NOTES, Brief Assessment, Lab Results, Assessment/Plan   Last Updated: 09-Apr-16 10:16 by Verdie Shire (MD)

## 2014-11-17 NOTE — Discharge Summary (Signed)
PATIENT NAME:  Mathew Morris, BOQUET MR#:  [id number] DATE OF BIRTH:  1924-03-14  DATE OF ADMISSION:  10/11/2014 DATE OF DISCHARGE:  10/15/2014  ADMITTING DIAGNOSIS:  Acute renal failure.  DISCHARGE DIAGNOSES:  1.  Acute renal failure due to urinary retention, now renal failure improved with Foley placement. The patient will need outpatient urology evaluation. He is started on Flomax and finasteride.  2.  History of cardiomyopathy.  3.  Coronary artery disease status post cardiac catheterization in January 2014 showing 100% right coronary artery blockage with collaterals.  Medical management recommended.  4.  History of chronic systolic and diastolic congestive heart failure with ejection fraction of 30%.  5.  Obstructive sleep apnea on CPAP.  6.  Benign prostatic hypertrophy.  7.  Hypothyroidism.  8.  Anemia of chronic disease.  9.  Chronic kidney disease stage IV. 10.  History of small cell lung cancer status post lobectomy.  11.  History of dysphagia, is on pureed diet.  12.  Pulmonary hypertension.  13.  Gout with acute exacerbation.  14.  Osteoarthritis. 15.  History of vocal cord dysfunction.  16.  Status post carpal tunnel release.  17.  Status post partial thyroidectomy.  18.  Status post left lower lobe resection of the lung cancer.  19.  Status post stent in the right carotid artery.  20.  Complex cyst due to the acute renal failure.  MRI was deferred at this point, may consider this as an outpatient.    CONSULTANTS: Dr. Jeffie Pollock, Jenny Reichmann.  PERTINENT LABORATORIES AND EVALUATIONS:  Admitting glucose 113, BUN 64, creatinine 3.95, sodium 141, potassium 5.3, chloride 104, CO2 of 29, calcium 9.2. LFTs showed albumin 3.2. WBC 13, hemoglobin 10.3, platelet count was 100,000. Urine cultures mixed urine flora. Renal ultrasound showed no hydronephrosis, nonobstructive calculus in the left renal collecting system, complex cystic lesion with thick septation and mural nodularity, MRI recommended.     HOSPITAL COURSE: Please refer to H and P done by the admitting physician. The patient is an 81 year old African American male with multiple medical problems who was recently diagnosed with urinary tract infection and took Cipro for 7 days and Bactrim for 3 days.  Started having a rash in the groins and legs, so came to the ED.  Found to have urinary retention, creatinine that was elevated. The patient was admitted for acute on chronic renal failure. His renal failure was thought to be due to urinary retention. His Foley was kept and urology evaluation was done. Urology recommended keeping the Foley in and outpatient followup. The patient's renal function has steadily improved close to his baseline. He was started on Flomax. He will need a urinary voiding trial. At this time, the patient is doing better and is stable. He was also noticed to have a gout flare which was treated with prednisone and now his gout flare has improved as well. He is stable for discharge.   DISCHARGE MEDICATIONS: Nitroglycerin 0.4 sublingual every 5 minutes as needed, allopurinol 100 mg 1 tablet p.o. b.i.d., isosorbide mononitrate 120 daily as needed,  20 daily as needed, acetaminophen/hydrocodone 325/5 q. 6 p.r.n. for pain, aspirin 325 p.o. daily, atorvastatin 10 daily, Refresh 1 to 2 drops 2 times a day as needed, carvedilol 6.25 one half tablet b.i.d., Plavix 75 p.o. daily, colchicine 0.6 one to two tablets 2 times a day as needed for gout flare, iron sulfate 325 1 tablet p.o. b.i.d., finasteride 5 daily, potassium chloride 10 mEq daily, prednisone taper starting at 60 taper  by 10 until complete, Flomax 0.4 daily, MiraLax 17 grams daily.   HOME HEALTH:  Yes with physical therapy and nurse.   DIET: Low sodium, low fat, low cholesterol, regular consistency.  ACTIVITY:  As tolerated.   FOLLOWUP:  With primary M.D. in 1 to 2 weeks. Follow up with [facility] in 1 to 2 weeks. Follow up with [facility] in 2 to 4  weeks.  Time spent on this discharge is 35 minutes.    ____________________________ Lafonda Mosses Posey Pronto, MD shp:sp D: 10/15/2014 20:29:28 ET T: 10/16/2014 11:24:43 ET JOB#: [id number]  cc: Lawrance Wiedemann H. Posey Pronto, MD, <Dictator> Alric Seton MD ELECTRONICALLY SIGNED 10/18/2014 14:18

## 2014-11-17 NOTE — Consult Note (Signed)
Chief Complaint:  Subjective/Chief Complaint Pt seen and examined. Temporary relief of dysphagia post dilation but dysphagia worse again.   VITAL SIGNS/ANCILLARY NOTES: **Vital Signs.:   06-Apr-16 12:03  Vital Signs Type Routine  Temperature Temperature (F) 97.8  Celsius 36.5  Temperature Source oral  Pulse Pulse 82  Respirations Respirations 18  Systolic BP Systolic BP 403  Diastolic BP (mmHg) Diastolic BP (mmHg) 50  Mean BP 67  Pulse Ox % Pulse Ox % 99  Pulse Ox Activity Level  At rest  Oxygen Delivery Room Air/ 21 %   Brief Assessment:  GEN no acute distress   Cardiac Regular   Respiratory clear BS   Gastrointestinal Normal   Lab Results:  Routine Chem:  06-Apr-16 04:48   Glucose, Serum  109 (65-99 NOTE: New Reference Range  09/24/14)  BUN  60 (6-20 NOTE: New Reference Range  09/24/14)  Creatinine (comp)  1.69 (0.61-1.24 NOTE: New Reference Range  09/24/14)  Sodium, Serum  152 (135-145 NOTE: New Reference Range  09/24/14)  Potassium, Serum 3.5 (3.5-5.1 NOTE: New Reference Range  09/24/14)  Chloride, Serum  115 (101-111 NOTE: New Reference Range  09/24/14)  CO2, Serum 29 (22-32 NOTE: New Reference Range  09/24/14)  Calcium (Total), Serum 8.9 (8.9-10.3 NOTE: New Reference Range  09/24/14)  Anion Gap 8  eGFR (African American)  41  eGFR (Non-African American)  35 (eGFR values <68m/min/1.73 m2 may be an indication of chronic kidney disease (CKD). Calculated eGFR is useful in patients with stable renal function. The eGFR calculation will not be reliable in acutely ill patients when serum creatinine is changing rapidly. It is not useful in patients on dialysis. The eGFR calculation may not be applicable to patients at the low and high extremes of body sizes, pregnant women, and vegetarians.)   Assessment/Plan:  Assessment/Plan:  Assessment Dysphagia. Malnutrition. Renal failure. Pt had refused PEG placement in the past but now willing to proceed.    Plan If plavix is held today, can proceed with PEG placement on Monday. Should be off plavix x 5 days before EGD with G placement can be scheduled. Ok to continue bASA but not full strength ASA. Keep tuning up patient over next few days. With his condition, prefer to do the G placement while in hospital rather than as outpt. thanks   Electronic Signatures: OVerdie Shire(MD)  (Signed 06-Apr-16 14:56)  Authored: Chief Complaint, VITAL SIGNS/ANCILLARY NOTES, Brief Assessment, Lab Results, Assessment/Plan   Last Updated: 06-Apr-16 14:56 by OVerdie Shire(MD)

## 2014-11-17 NOTE — H&P (Signed)
PATIENT NAME:  Mathew Morris, KIERNAN MR#:  [id number] DATE OF BIRTH:  Dec 17, 1923  DATE OF ADMISSION:  10/11/2014  PRIMARY CARE PHYSICIAN: Irven Easterly. Kary Kos, MD  REFERRING EMERGENCY ROOM PHYSICIAN: Larae Grooms, MD  CHIEF COMPLAINT: Acute renal failure.   HISTORY OF PRESENTING ILLNESS: An 81 year old male who has a history of coronary artery disease, obstructive sleep apnea, BPH, hypothyroidism, chronic kidney disease, small cell lung cancer, dysphagia, pulmonary hypertension. For the last 10 days he had complaint of increased urinary frequency and burning and so went to primary care physician doctor's office, found to have a UTI, was given ciprofloxacin prescription for 7 days. He finished that. Still his problem continued, so 3 days ago when he went back he was given Bactrim tablets. He started taking that, but within the next day he started having some rashes on his both legs and his inguinal area, and it is also itching a little bit, so he decided to come to the Emergency Room today. On initial workup in the ER, he was noted to have acute worsening of his chronic renal failure and was asked to give a urine sample. He gave some small urine sample, but after that on bladder scan he still had 700 mL of urine in the bladder so Foley catheter was placed in for this obstruction, and given to hospitalist team for further management of renal failure and possible obstruction. On further questioning, he denies any complaint of fever, chills.   REVIEW OF SYSTEMS: GENERAL: Denies any fever, weight loss, weight gain, or generalized weakness.  EYES: No blurring, double vision, discharge, or redness.  EARS, NOSE, THROAT: No tinnitus, ear pain, or hearing loss.  RESPIRATORY: No cough, wheezing, hemoptysis, or shortness of breath.  CARDIOVASCULAR: No chest pain, orthopnea, edema, arrhythmia, palpitation.  GASTROINTESTINAL: No nausea, vomiting, diarrhea, abdominal pain.  GENITOURINARY: The patient has increased frequency  of urination and some burning in the urine.  SKIN: No acne but has rashes on both legs and in inguinal area and mild itching. LEGS: No swelling. JOINTS: No swelling or tenderness.  NEUROLOGICAL: No numbness, weakness, tremor, or vertigo.  PSYCHIATRIC: No anxiety, insomnia, bipolar disorder.   PAST MEDICAL HISTORY: 1.  History of cardiomyopathy. 2.  Coronary artery disease status post catheterization in January 2014 showing 100% RCA blockage with collaterals, medical management recommended; 70% proximal RCA and 50% proximal LAD blockages in the past.  3.  Congestive heart failure, both systolic and diastolic, with ejection fraction 30% on echocardiogram.  4.  Obstructive sleep apnea on CPAP.  5.  BPH.  6.  Hypothyroidism.  7.  Anemia of chronic disease. 8.  CKD stage 3 to 4 with a creatinine baseline around 1.5, 6 to 7 months ago.  9.  History of small cell lung cancer and status post lobectomy.  10.  History of dysphagia. He is on pureed diet currently.  11.  Pulmonary hypertension.  13.  Osteoarthritis.  14.  History of vocal cord abnormality. The patient does have a voice recorder with which he talks.   PAST SURGICAL HISTORY:  1.  Cardiac catheterization.  2.  Carpal tunnel surgery.  3.  Partial thyroidectomy.  4.  Left lower lobe resection for lung cancer.  5.  Carotid stenosis, status post stent in right carotid artery.   SOCIAL HISTORY: Lives at home with wife. Ambulates with a cane, sometimes walker. Gets short of breath. He used to smoke but he quit many years ago.   FAMILY HISTORY: Positive for hypertension and heart  disease in family.   HOME MEDICATIONS: 1.  Torsemide 20 mg oral once a day.  2. Ramipril 2.5 mg once a day.  3.  Prednisone 10 mg oral once a day.  4.  Potassium chloride 10 mEq once a day.  5.  Nitroglycerin 0.4 mg sublingual every 5 minutes as needed.  6.  Isosorbide dinitrate 120 mg oral as needed for chest pain.  7.  Finasteride 5 mg oral once a day.   8.  Ferrous sulfate 325 mg 2 times a day.  9.  Colchicine 0.6 mg oral 2 times a day.  10.  Plavix 75 mg once a day. 11.  Carvedilol 6.25 mg tablet, take half 2 times a day.  12.  Atorvastatin 10 mg once a day.  13.  Aspirin 325 mg once a day.  14.  Allopurinol 100 mg 2 times a day.  15.  Acetaminophen and hydrocodone 325/5 mg every 6 hours as needed for pain.   PHYSICAL EXAMINATION:  VITAL SIGNS: Temperature 98, pulse 81, respirations 18, blood pressure 96/55, and pulse oximetry is 98 on room air.  GENERAL: The patient is fully alert and oriented to time, place, and person. Does not appear in acute distress.  HEENT: Head and neck atraumatic. Conjunctivae pink. Oral mucosa moist.  NECK: Supple. No JVD.  RESPIRATORY: Bilateral equal and clear air entry. No wheezing or crepitation.  CARDIOVASCULAR: S1, S2 present. Systolic murmur present. No tenderness on chest palpation.  ABDOMEN: Soft, nontender. Bowel sounds present. No organomegaly.  SKIN: No acne. He has small punctate rashes on both his distal part of the legs and his inguinal area bilaterally. EXTREMITIES: Legs: No edema. Joints: No swelling or tenderness. LYMPHATIC: No inguinal lymph node.  NEUROLOGICAL: Power 4/5. Follows commands and moves all 4 limbs. No tremor or rigidity. Sensation is intact. Reflexes intact.  PSYCHIATRIC: Does not appear in acute psychiatric illness at this time.  IMPORTANT LABORATORY RESULTS: 1.  Glucose 113, BUN 64, creatinine 3.95, sodium 141, potassium 5.3, chloride 104, CO2 of 29, calcium is 9.2. 2.  Total protein is 7.4, albumin 3.2, bilirubin 0.4, alkaline phosphatase 95, SGOT 32, and SGPT 27. 3.  WBC 13, hemoglobin is 10.3, platelet count is 100,000, MCV is 94. 4.  Urinalysis is 4 WBCs and 2+ leukocyte esterase.   ASSESSMENT AND PLAN: An 81 year old male with a history of coronary artery disease, benign prostatic hypertrophy, obstructive sleep apnea, congestive heart failure, chronic kidney disease,  hypothyroidism, and lung cancer and dysphagia, presented with taking 7 days of Cipro and after that, still no resolution of the symptoms of urinary tract infection so given Bactrim for 3 days and had rashes on the skin, so came to the Emergency Room, but he was noted to have renal failure also.  1.  Acute on chronic renal failure. His creatinine appears to be around 1.5 in October 2015 and before that so that we would consider that his baseline. Currently, he is more than 3. Will give some intravenous fluid; most likely it is because of dehydration, but there is a component of obstruction also as there was 700 mL residual urine and Emergency Room physician had to place a catheter, so we will get ultrasound, bilateral kidneys, and call urologic consult.  2.  Urinary retention. Most likely it is because of benign prostatic hypertrophy and an infection. Foley catheter is placed. We will give Flomax and check his renal function tomorrow and get urology consult and ultrasound, bilateral kidneys. 3.  Coronary artery  disease. We will continue aspirin, Plavix, statin, and carvedilol as he was taking at home. Will hold lisinopril because of renal failure.  4.  Benign prostatic hypertrophy. Continue finasteride. Added Flomax.  5.  Congestive heart failure. He is taking torsemide at home, but currently because of renal failure we will hold any diuretics and we will have to give some intravenous fluid. Continue monitoring for fluid overload. He has chronic systolic congestive heart failure, ejection fraction around 30% to 35% as per previous records.  CODE STATUS: Full code.  TOTAL TIME SPENT ON THIS ADMISSION: 50 minutes.   ____________________________ Ceasar Lund Anselm Jungling, MD vgv:ST D: 10/11/2014 21:14:16 ET T: 10/11/2014 22:00:24 ET JOB#: [id number]  cc: Ceasar Lund. Anselm Jungling, MD, <Dictator> Irven Easterly. Kary Kos, MD Vaughan Basta MD ELECTRONICALLY SIGNED 10/24/2014 0:33

## 2014-11-17 NOTE — H&P (Signed)
PATIENT NAME:  Mathew Morris, Mathew Morris MR#:  [id number] DATE OF BIRTH:  1924/01/24  DATE OF ADMISSION:  10/22/2014  PRIMARY CARE PHYSICIAN:  Dr. Maryland Pink.   PRIMARY CARDIOLOGIST:  Dr. Ubaldo Glassing.   GASTROENTEROLOGIST:  Dr. Candace Cruise.   CHIEF COMPLAINT: Difficulty swallowing.   HISTORY OF PRESENT ILLNESS:  Mathew Morris is a pleasant 81 year old African-American gentleman who was recently admitted on March 25, discharged on March 29 with complaints of dysphagia, acute on chronic renal failure, comes in today with increasing difficulty in swallowing. The patient was found to have had upper GI endoscopy on March 3 with balloon dilatation of his esophageal stricture. He was also noted to have esophagitis. The patient is still having difficulty swallowing and feels like things are hanging up in his throat and further down his chest. He was evaluated by speech therapist in the past. There was apparently a discussion made for possible PEG tube insertion down the road per Dr. Candace Cruise according to the wife.   In the Emergency Room the patient appears to be dehydrated with sodium of 155. His creatinine is 2.06, his baseline is around 1.5-1.9. He is currently getting IV fluids in the Emergency Room. He denies any chest pain, shortness of breath, or abdominal pain. He also has a chronic indwelling Foley catheter secondary to urinary retention.   PAST MEDICAL HISTORY:  1.  Coronary artery disease status post cardiac catheterization done in January of 2014, which showed 100% RCA with EF of 30% and blockage with collaterals. Medical management was recommended at that time. He also has a 50% LAD blockage.  2.  CKD stage III-IV.  3.  Small cell lung cancer with resection.  4.  Dysphagia 3.  5.  History of pulmonary hypertension.  6.  Congestive heart failure with severe systolic cardiomyopathy, EF of 30%.  7.  Obstructive sleep apnea.  8.  BPH with urinary retention, has a Foley catheter.  9.  Hypothyroidism.   PAST SURGICAL HISTORY:   1. Carotid artery stenosis with stent in the right carotid artery.  2. Lower lobe resection for lung cancer.  3. Partial thyroidectomy.  4. Carpal tunnel syndrome surgery.  5. Cardiac catheterization in January of 2014.   SOCIAL HISTORY: Lives at home with wife.  He used to smoke, quit many years ago. Does not drink any alcohol at this time.   HOME MEDICATIONS:  1. Torsemide 20 mg b.i.d.  2. Tamsulosin 0.4 mg p.o. daily.  3. The patient just completed a prednisone taper.  4. K-Dur 10 mEq p.o. daily.  5. Nitroglycerin 0.4 mg 1 tablet sublingual as needed.  6. Isosorbide mononitrate 120 mg p.o. daily.  7. Finasteride 5 mg p.o. daily at bedtime.  8. Ferrous sulfate 325 one tablet b.i.d.  9. Colchicine 0.6 mg 1-2 orally b.i.d.  10. Plavix 75 mg p.o. daily.  11. Carvedilol 6.25 half a tablet b.i.d.  12. Atorvastatin 10 mg p.o. daily.  13. Aspirin 325 mg p.o. daily at bedtime.  14. Allopurinol 100 mg b.i.d.  15. Acetaminophen/hydrocodone 5/325 one every 6 hours as needed.   ALLERGIES: BACTRIM AND PREVPAC.   FAMILY HISTORY: Significant for hypertension and heart disease in the family.   REVIEW OF SYSTEMS:    CONSTITUTIONAL: Positive for fatigue, weakness.  EYES: No blurred or double vision or glaucoma.  EARS, NOSE, AND THROAT: No tinnitus, ear pain, hearing loss.  RESPIRATORY: No cough. Positive for mild shortness of breath. No hemoptysis.  CARDIOVASCULAR: No chest pain, orthopnea, edema. Positive for hypertension.  GASTROINTESTINAL: Positive for dysphagia. No melena or abdominal pain or diarrhea.  GENITOURINARY: Positive for urinary retention and has a chronic Foley.  MUSCULOSKELETAL: Positive for arthritis, back pain. No swelling, no gout.   HEMATOLOGY: Positive for anemia of chronic disease. No bleeding disorder.  ENDOCRINE: No polyuria, nocturia, or thyroid problems.  PSYCHIATRIC: No anxiety, depression.  NEUROLOGIC: No CVA, TIA, dysarthria. Positive for dysphagia.    PHYSICAL EXAMINATION:  GENERAL: The patient is awake, alert, oriented x 3, not in acute distress.  VITAL SIGNS: Afebrile, pulse is 92, blood pressure is 113/65, saturation are 100% on room air.  HEENT: Atraumatic, normocephalic. Pupils PERRLA. EOM intact. Oral mucosa is dry.  NECK: Supple. No JVD. No carotid bruit.  LUNGS: Clear to auscultation bilaterally. No rales, rhonchi, respiratory distress, or labored breathing.  CARDIOVASCULAR: Both heart sounds are normal. Rate and rhythm regular. PMI not lateralized. Chest nontender.  EXTREMITIES: Good pedal pulses, good femoral pulses. No lower extremity edema.  ABDOMEN:  Very soft scaphoid, nontender. No organomegaly. Positive bowel sounds.  NEUROLOGIC: Grossly intact cranial nerves II through XII. No motor or sensory deficit.  PSYCHIATRIC: The patient is awake, alert, oriented x 3. Mood and affect normal.  SKIN: Warm and dry.   LABORATORY DATA:   1.  Chest x-ray, no acute cardiopulmonary abnormality.  2.  UA positive for UTI 3.  White count is 18,000, H and H is 12.2 and 39.2, platelet count is 128,000. Glucose is 114, BUN is 73. Creatinine is 2.03. Sodium is 155, potassium is 4.1. LFTs within normal limits. PT/INR is 16.8 and 1.3. Troponin is 2.69.   ASSESSMENT: An 81 year old, Mathew Morris, with history of severe cardiomyopathy, ejection fraction of 30%, along with history of chronic dysphagia with history of anemia, hypothyroidism, and hypertension, comes in with:   1.  Severe hypernatremia and acute on chronic renal failure secondary to dehydration from poor p.o. intake, secondary to his acute on chronic dysphagia. The patient will be admitted. We will admit the patient to telemetry floor. Give IV D5 with water and try to correct his sodium. Monitor sodium closely, monitor Is and Os and creatinine as well. Consider nephrology consultation if needed.  2.  Elevated troponin without any history of chest pain in the setting of chronic kidney  disease. We will continue the patient's cardiac medications which are aspirin, Plavix, his statins, and his blood pressure medications. Would cycle cardiac enzymes x 3. Case was discussed with Dr. Ubaldo Glassing who will see the patient in consultation. At this point I am not starting any blood thinners, any IV heparin or Lovenox.  3.  Leukocytosis, suspected due to urinary tract infection. The patient has history of benign prostatic hypertrophy, urinary retention, and catheter that was placed. I will start him empirically on IV Rocephin, follow up urine cultures, and adjust antibiotic if needed.  4.  Severe cardiomyopathy with ejection fraction of 30%, chronic. The patient does not appear to be in heart failure. We will monitor his symptoms, will give fluids cautiously. I will hold off on his Lasix at this time given his sodium is so high.  5.  History of benign prostatic hypertrophy. Continue tamsulosin and finasteride.  6.  Deep vein thrombosis prophylaxis. Subcutaneous heparin t.i.d.   The above was discussed with the patient and the patient's wife. Code status was addressed with the patient, he wants to be a full code.   TIME SPENT: 50 minutes.    ____________________________ Hart Rochester Posey Pronto, MD sap:bu D: 10/22/2014 20:18:56 ET  T: 10/22/2014 20:56:09 ET JOB#: [id number]  cc: Sharnette Kitamura A. Posey Pronto, MD, <Dictator> Ilda Basset MD ELECTRONICALLY SIGNED 10/29/2014 16:39

## 2014-11-17 NOTE — Consult Note (Signed)
Brief Consult Note: Diagnosis: Cystic Renal Mass; Urinary retention.   Patient was seen by consultant.   Comments: Renal US films remarkable complex cystic lesion in the left kidney that is suspicious for a possible neoplasm.  His Creatinine has fallen slightly from 3.72 to 2.85 with a UOP of 896m for the last 24hrs.    An MRI of the abdomen w/wo contrast was recommended for further evaluation of the renal cyst but with his renal insufficiency he is not a candidate for contrast and with him age and comorbidities it is unlikely he would be a candidate for surgical managment of the mass.  Continue reccommendation further foley drainage and [facility].  Foley catheter remains in place.  May complete voiding trial prior to discharge.   An MRI could be reconsidered post discharge and if the Cr declines to his prior baseline contrast could be considered, otherwise he would need a non-contrast study.  Patient also complaining of constipation today and requesting Miralax.  Abdomen soft and NT.  Electronic Signatures: OHerbert Moors(NP)  (Signed 28-Mar-16 09:56)  Authored: Brief Consult Note   Last Updated: 28-Mar-16 09:56 by OHerbert Moors(NP)

## 2014-11-17 NOTE — Consult Note (Signed)
Patient states he is not going home until he has a BM.  He has no other complaints at this time.  I have ordered a Fleets enema to assist in the passage of his stool.   was found to have a complex cyst in his left kidney on RUS during this admission.  A MRI is recommended w/wo, but his serum creatinine is 2.46 today.  He has a base line history of 1.5.  He is being discharged today and he will follow up at [facility] in 1-2 weeks. Hopefully, his serum creatinine will improve over the next few days at home.   His UOP over the last 24 hours has been 1150cc.  The foley is draining clear yellow urine.  Patient will have a voiding trial prior to discharge.  He is to continue the tamsulosin and finasteride as an out patient.     Electronic Signatures: Zara Council A (PA)  (Signed on 29-Mar-16 07:28)  Authored  Last Updated: 29-Mar-16 07:28 by Zara Council A (PA)

## 2014-11-17 NOTE — Consult Note (Signed)
PATIENT NAME:  Mathew Morris, Mathew Morris MR#:  [id number] DATE OF BIRTH:  [birth year]  DATE OF CONSULTATION:  10/22/2014  CONSULTING PHYSICIAN:  Manya Silvas, MD  HISTORY OF PRESENT ILLNESS: The patient is an 81 year old black male with a history of multiple medical problems, who presented to the ER with inability to swallow and dehydration, where he was found to have a serum sodium of 155, elevated BUN and creatinine. He was admitted to the hospital. I was asked to see him in consultation.   The patient had an upper endoscopy and dilatation and by Dr. Verdie Shire on March 3 with a balloon dilatation up to 18 French balloon. He also had grade B esophagitis noted. Since his dilatation, he is still having significant difficulty with swallowing, feels like things hang up in the throat and then further down in his chest.  Apparently plans have been made with Dr. Candace Cruise to do a feeding tube insertion.  One of the factors of this is that he is on Plavix.  This may need to be held for a few days before attempting a feeding tube.  PAST MEDICAL HISTORY: Includes coronary artery disease, obstructive sleep apnea with CPAP, BPH, hypothyroidism, chronic kidney disease, small cell lung cancer with previous resection, dysphagia, as mentioned above, pulmonary hypertension. He also has chronic kidney disease, stage 3, with a creatinine baseline of about 1.5 a few months ago.  It has gone up recently. Congestive heart failure, both systolic and diastolic, with ejection fraction 30%, coronary artery disease with catheterization in January 2014 showing 100% right coronary artery  blockage with collaterals. Medical management recommended, proximal LAD 50% blockage in the past.   He was admitted to the hospital on 10/11/2014 and discharged 10/15/2014 for problem with a urinary tract infection.  This was treated with Cipro and then Bactrim.   On the recent admission, he was noted to have bladder retention and he had 700 mL of urine in his  bladder on the scan and a Foley catheter was placed and it is still in place.   REVIEW OF SYSTEMS:  CONSTITUTIONAL: He has been losing some weight. He has great difficulty with swallowing.  EYES:  No current complaints.  EARS, NOSE:  No current complaints. RESPIRATORY: No coughing or wheezing.  CARDIOVASCULAR: No chest pains. GASTROINTESTINAL: No vomiting. No diarrhea.   PAST SURGICAL HISTORY: Carotid stenosis with a stent in the right carotid artery, left lower lobe resection for lung cancer, partial thyroidectomy, carpal tunnel syndrome surgery, and cardiac catheterization.   HABITS: Used to smoke, but quit many years ago. Does not drink alcohol at this time.   HOME MEDICATIONS: Torsemide 20 mg once a day, ramipril 2.5 mg daily, prednisone 10 mg oral once a day, potassium chloride 10 mEq a day, nitroglycerin 0.4 mg sublingual q. 5 minutes if needed, isosorbide 120 mg oral as needed for chest pain, finasteride 5 mg oral once a day, ferrous sulfate 325 mg 2 times a day, colchicine 0.6 mg 2 times a day, Plavix 75 mg once a day, carvedilol 6.25 mg 1/2 tablet twice a day, atorvastatin 10 mg a day, aspirin 325 mg a day, allopurinol 100 mg 2 times a day, acetaminophen and hydrocodone 325 mg/5 mg every 6 hours as needed for pain.   PHYSICAL EXAMINATION: GENERAL: An elderly black male in no acute distress, looks emaciated.  HEENT: Sclerae nonicteric. Conjunctivae negative. Tongue is dry. Trachea is in the midline.  CHEST: Clear anterior fields.  HEART: Shows no murmurs or gallops  I could hear.  ABDOMEN: Flat.  There is a palpable mass, possibly the aorta or intestines pressing against the aorta and transmitting the impulse.  I do not hear any abdominal bruits.  EXTREMITIES: Show no edema. He has a Foley catheter in place.   LABORATORY DATA: Glucose 114, BUN 73, creatinine 2.04, sodium 155, potassium 4.1, chloride 111, CO2 of 33, indirect bilirubin 0.4, calcium 9.8, total protein 7.7, albumin 3.3,  total bilirubin 0.5, direct bilirubin 0.1, alkaline phosphatase 91, SGOT 37, SGPT 57. Troponin 2.69, white count 18.8, hemoglobin 12.2, hematocrit 39.2, platelet count 128,000. Pro time 16.3, INR 1.3. Urine shows 3+ leukocyte esterase, 92 white cells per high-powered field, 9 red cells per high-powered field.  IMAGING: A portable chest x-ray shows no acute cardiopulmonary disease.   ASSESSMENT: The patient with inability to swallow very much, who has gotten dehydrated with hypernatremia, probably from decreased intake of free water. His family says that Dr. Candace Cruise has been talking about putting a feeding tube in and that this will plan to be done soon. He needs to come in the hospital now for intravenous hydration because of his severe hypernatremia and his urinary insufficiency. He is noted also to have elevated troponin levels.  RECOMMENDATIONS: Cardiology consultation as well as nephrology consultation. I will talk to Dr. Candace Cruise tomorrow about ascending care for him.    ____________________________ Manya Silvas, MD rte:LT D: 10/22/2014 18:53:53 ET T: 10/22/2014 19:27:07 ET JOB#: [id number]  cc: Manya Silvas, MD, <Dictator> Lupita Dawn. Candace Cruise, MD Manya Silvas MD ELECTRONICALLY SIGNED 11/02/2014 16:19

## 2014-11-17 NOTE — Consult Note (Signed)
20 Fr PEG successfully placed in antrum. Ok to use G tube today for meds and flushes. If stable, can start TF tomorrow AM. Would ask nutritionist about how much TF to give. Would also give regular flushes with water to prevent dehydration again in the future. Some bleeding with insertion. If bleeding stops, ok to resume ASA tomorrow. Also, order tube feedings as well as syringes for patient to use at home. Thanks.   Electronic Signatures: Verdie Shire (MD) (Signed on 11-Apr-16 14:52)  Authored   Last Updated: 12-Apr-16 09:03 by Verdie Shire (MD)

## 2014-11-17 NOTE — Discharge Summary (Signed)
PATIENT NAME:  Mathew Morris, Mathew Morris MR#:  [id number] DATE OF BIRTH:  [birth year]  DATE OF ADMISSION:  10/22/2014 DATE OF DISCHARGE:  10/29/2014  DISCHARGE DIAGNOSES:  1.  Hypernatremia, resolved.  2.  Acute on chronic kidney disease stage II at baseline.  3.  Hypokalemia, replaced and resolved.   4.  Chronic dysphagia status post gastrostomy tube placement.  5.  Moderate malnutrition from chronic poor oral intake.  6.  Enterococcus urinary tract infection treated.   7.  Elevated troponin due to supply/demand ischemia.   SECONDARY DIAGNOSES:   1.  Coronary artery disease status post cardiac catheterization done in January of 2014, which showed 100% RCA with EF of 30% and blockage with collaterals. Medical management was recommended at that time. He also has a 50% LAD blockage.  2.  CKD stage III-IV.  3.  Small cell lung cancer with resection.  4.  Dysphagia 3.  5.  History of pulmonary hypertension.  6.  Congestive heart failure with severe systolic cardiomyopathy, EF of 30%.  7.  Obstructive sleep apnea.  8.  BPH with urinary retention, has a Foley catheter.  9.  Hypothyroidism.   CONSULTATIONS:  1.  Cardiology - Chrissie Noa A. Ubaldo Glassing, MD 2.  Speech Therapy.  3.  [place], MD  4.  Gastroenterology - Lupita Dawn. Oh, MD  PROCEDURES AND RADIOLOGY:   1.  Gastrostomy tube placement by Dr. Candace Cruise on 10/28/2014 successfully without any immediate complications .  2.  Chest x-ray on 10/22/2014 showed no acute cardiopulmonary disease.   MAJOR LABORATORY PANEL:  Urinalysis on admission showed 2+ bacteria, 3+ leukocyte esterase, 92 WBCs.    Blood cultures x 2 were negative.  Urine culture grew more than 100,000 colonies of Enterococcus faecalis.   HISTORY AND SHORT HOSPITAL COURSE:  The patient is an 81 year old male with the above-mentioned medical problems, was admitted for difficulty swallowing.  Please see Dr. Gus Height Patel's dictated history and physical for further details.  GI  consultation was obtained with Dr. Verdie Shire who had discussion with family, who decided the patient will go for G-tube placement, which Dr. Candace Cruise performed on 10/28/2014.  The patient was also evaluated by cardiology, Dr. Bartholome Bill for cardiac clearance and patient was cleared cardiology-wise to undergo PEG tube placement.  The patient was evaluated by physical therapy and was recommended home with home health, which was provided by Care Management.  The patient was doing okay and family were in agreement with discharge plan and he is being discharged home in stable condition.   VITAL SIGNS: On the date of discharge, his vital signs are as follows:  Temperature 98.2, heart rate 86 per minute, respirations 20 per minute, blood pressure 116/86, he is saturating 96% on room air.   PERTINENT PHYSICAL EXAMINATION ON THE DAY OF DISCHARGE:   CARDIOVASCULAR:  S1, S2 normal.  No murmurs, rubs or gallop.  LUNGS: Clear to auscultation bilaterally.  No wheezing, rales, rhonchi or crepitation. ABDOMEN:  Soft, benign.  NEUROLOGIC:  Nonfocal examination.   SKIN:  He has G-tube in place without any signs of any infection.  All other physical examination remained at baseline.   DISCHARGE MEDICATIONS:  Medication Instructions  nitroglycerin 0.4 mg sublingual tablet  1 tab(s) sublingual every 5 minutes, As Needed - for Chest Pain   allopurinol 100 mg oral tablet  1 tab(s) orally 2 times a day   isosorbide mononitrate 120 mg oral tablet, extended release  1 tab(s) orally once a day,  As Needed for chest pain.    acetaminophen-hydrocodone 325 mg-5 mg oral tablet  1 tab(s) orally every 6 hours, As Needed - for Pain   atorvastatin 10 mg oral tablet  1 tab(s) orally once a day   carvedilol 6.25 mg oral tablet  0.5 tab(s) orally 2 times a day (with meals)   colchicine 0.6 mg oral tablet  1-2 tab(s) orally 2 times a day, As Needed for gout attack.  2 tabs at first sign of gout flare followed by 1 tab one hour later    ferrous sulfate 325 mg oral tablet  1 tab(s) orally 2 times a day (with meals)   tamsulosin 0.4 mg oral capsule  1 cap(s) orally once a day (after a meal)   aspirin 325 mg oral tablet  1 tab(s) orally once a day (at bedtime)   clopidogrel 75 mg oral tablet  1 tab(s) orally once a day (at bedtime)   finasteride 5 mg oral tablet  1 tab(s) orally once a day (at bedtime)   potassium chloride 10 meq oral tablet, extended release  1 tab(s) orally once a day (at bedtime)   torsemide 20 mg oral tablet  1 tab(s) orally 2 times a week on Monday and Thursday   jevity 1.2 cal bolus feed  237 milliliter(s)  4 times a day per dietitian orders    DISCHARGE DIET:  Low sodium, low fat, low cholesterol, 1800 ADA.  Tube feeds per dietitian's order, pureed with nectar thick liquids and strict aspiration precaution as baseline per Speech Therapy, moisten food well with condiments.   DISCHARGE ACTIVITY:  As tolerated.   DISCHARGE INSTRUCTIONS:  Followup:  The patient was instructed to follow up with his primary care physician, Dr. Jarome Lamas, in 1 to 2 weeks and then follow up with [facility] in 2 to 4 weeks.   TOTAL TIME DISCHARGING THIS PATIENT:  40 minutes.    He remains at very high-risk for readmission.     ____________________________ Lucina Mellow. Manuella Ghazi, MD vss:NT D: 10/29/2014 17:26:28 ET T: 10/29/2014 18:54:14 ET JOB#: [id number]  cc: Lunah Losasso S. Manuella Ghazi, MD, <Dictator> Irven Easterly. Kary Kos, MD Javier Docker Ubaldo Glassing, MD Efraim Kaufmann, MD Lupita Dawn. Candace Cruise, MD  Remer Macho MD ELECTRONICALLY SIGNED 10/31/2014 10:33

## 2014-11-17 NOTE — Consult Note (Signed)
General Aspect CC: difficulty voiding   Present Illness Mathew Morris is an 81 yo AA male who I was asked to see in consultation by Dr. Rosalio Macadamia for urinary retention and a persistent UTI.   He was seen by his PCP a couple of weeks ago with dysuria and was found to have a UTI.  He was given cipro for a week but on follow up was found to have a persistent UTI.  He was given Bactrim but developed a reaction to that med with erythema of the toes, but he also began to have increased difficulty voiding with a reduced stream and straining to void.   He was seen in the ER and was found to have a PVR on [place] of 752m.  A foley catheter was inserted.  His urologic history is significant for BPH with BOO that has been treated with finasteride by Dr. SBernardo Heaterwho has now left the area.   He has had no GU surgery or history of stones.  He has a history of CKD with a baseline Cr of about 1.5.  It is up to over 3 on this admission.   He has no associated signs or symptoms.  ROS: -fever, -hematuria, -flank or abdominal pain, + dysphagia, +SOB, + redness of toes.   Otherwise negative 12 system review.  SH: former smoker and former alcohol use but not in 50 years.   Home Medications: Medication Instructions Status  nitroglycerin 0.4 mg sublingual tablet 1 tab(s) sublingual every 5 minutes, As Needed - for Chest Pain Active  allopurinol 100 mg oral tablet 1 tab(s) orally 2 times a day Active  isosorbide mononitrate 120 mg oral tablet, extended release 1 tab(s) orally once a day, As Needed for chest pain.  Active  torsemide 20 mg oral tablet 1 tab(s) orally once a day as needed Active  acetaminophen-HYDROcodone 325 mg-5 mg oral tablet 1 tab(s) orally every 6 hours, As Needed - for Pain Active  ramipril 2.5 mg oral capsule 1 cap(s) orally once a day Active  aspirin 325 mg oral tablet 1 tab(s) orally once a day Active  atorvastatin 10 mg oral tablet 1 tab(s) orally once a day Active  Refresh - ophthalmic solution 1-2  drop(s) to each affected eye 2 times a day, As Needed for dry eyes. Active  carvedilol 6.25 mg oral tablet 0.5 tab(s) orally 2 times a day (with meals) Active  clopidogrel 75 mg oral tablet 1 tab(s) orally once a day Active  colchicine 0.6 mg oral tablet 1-2 tab(s) orally 2 times a day, As Needed for gout attack.  Active  ferrous sulfate 325 mg oral tablet 1 tab(s) orally 2 times a day (with meals) Active  finasteride 5 mg oral tablet 1 tab(s) orally once a day Active  potassium chloride 10 mEq oral tablet, extended release 1 tab(s) orally once a day Active  predniSONE 10 mg oral tablet 1 tab(s) orally once a day, As Needed Active    Bactrim: Rash  Prevpac: Unknown  Case History and Physical Exam:  Chief Complaint difficulty voiding   Past Medical Health Coronary Artery Disease, CHF, CAD, OSA, hypothyroidism, Anemia, CKD, Small cell lung CA, pulmonary hypertension, osteoarthritis, dysphagia   Past Surgical History Cardiac cath, Carpal tunnel surgery, partial thyroidectomy, LLL lung resection, Right carotid stent   Family History Negative   Neck/Nodes Supple  No Adenopathy   Chest/Lungs Clear  with nl effort but decreased BS on the left   Cardiovascular No Murmurs or Gallops  Normal Sinus Rhythm   Abdomen Soft, flat, NT without mass, HSM or hernias   Genitalia Nl phallus with foley at meatus.  Scrotum and testes are unremarkable.   Rectal NST without mass.  Prostate 2+ benign with non-palpable SV's   Musculoskeletal Non-tender without edema   Neurological A/O x 3 without focal deficits   Skin Warm  Dry   Nursing/Ancillary Notes: **Vital Signs.:   26-Mar-16 07:35  Vital Signs Type Q 8hr  Temperature Temperature (F) 98.3  Celsius 36.8  Temperature Source oral  Pulse Pulse 86  Respirations Respirations 18  Systolic BP Systolic BP 735  Diastolic BP (mmHg) Diastolic BP (mmHg) 67  Mean BP 85  Pulse Ox % Pulse Ox % 95  Pulse Ox Activity Level  At rest  Oxygen Delivery  Room Air/ 21 %   Routine Chem:  25-Mar-16 14:26   BUN  64 (6-20 NOTE: New Reference Range  09/24/14)  Creatinine (comp)  3.95 (0.61-1.24 NOTE: New Reference Range  09/24/14)  Potassium, Serum  5.3 (3.5-5.1 NOTE: New Reference Range  09/24/14)  eGFR (African American)  15  eGFR (Non-African American)  13 (eGFR values <31m/min/1.73 m2 may be an indication of chronic kidney disease (CKD). Calculated eGFR is useful in patients with stable renal function. The eGFR calculation will not be reliable in acutely ill patients when serum creatinine is changing rapidly. It is not useful in patients on dialysis. The eGFR calculation may not be applicable to patients at the low and high extremes of body sizes, pregnant women, and vegetarians.)  26-Mar-16 05:10   BUN  58 (6-20 NOTE: New Reference Range  09/24/14)  Creatinine (comp)  3.72 (0.61-1.24 NOTE: New Reference Range  09/24/14)  Potassium, Serum 4.3 (3.5-5.1 NOTE: New Reference Range  09/24/14)  eGFR (African American)  16  eGFR (Non-African American)  14 (eGFR values <6108mmin/1.73 m2 may be an indication of chronic kidney disease (CKD). Calculated eGFR is useful in patients with stable renal function. The eGFR calculation will not be reliable in acutely ill patients when serum creatinine is changing rapidly. It is not useful in patients on dialysis. The eGFR calculation may not be applicable to patients at the low and high extremes of body sizes, pregnant women, and vegetarians.)  Routine UA:  25-Mar-16 19:06   Color (UA) Yellow  Clarity (UA) Clear  Glucose (UA) Negative  Bilirubin (UA) Negative  Ketones (UA) Negative  Specific Gravity (UA) 1.009  Blood (UA) Negative  pH (UA) 6.0  Protein (UA) Negative  Nitrite (UA) Negative  Leukocyte Esterase (UA) 2+ (Result(s) reported on 11 Oct 2014 at 07:23PM.)  RBC (UA) 2 /HPF  WBC (UA) 4 /HPF  Bacteria (UA) NONE SEEN  Epithelial Cells (UA) 1 /HPF  Mucous (UA) PRESENT  (Result(s) reported on 11 Oct 2014 at 07:23PM.)  Routine Hem:  25-Mar-16 14:26   WBC (CBC)  13.0  RBC (CBC)  3.49  Hemoglobin (CBC)  10.3  Hematocrit (CBC)  32.7  Platelet Count (CBC)  100 (Result(s) reported on 11 Oct 2014 at 02:51PM.)    Impression 1: BPH with BOO and recent retention. 2: Recent UTI but UA ok on admission. 3: Progressive renal insufficiency possibly from obstructive uropathy.   Plan 1: Agree with renal [place]: Continue flomax and finasteride. 3: He will need a voiding trial at some point but if he will be discharged in the next 48 hours that [facility]  He  could be discharged with a leg bag.   4: If he is still here on Monday he could have the foley removed at 5 am for a voiding trial with bladder scans prior to discharge and foley replacement if he is not able to void at least 50% of his bladder volume.   Electronic Signatures: Mathew Morris (MD)  (Signed 26-Mar-16 10:28)  Authored: General Aspect/Present Illness, Home Medications, Allergies, History and Physical Exam, Vital Signs, Labs, Impression/Plan   Last Updated: 26-Mar-16 10:28 by Mathew Morris (MD)

## 2014-11-26 ENCOUNTER — Emergency Department: Payer: Medicare Other

## 2014-11-26 ENCOUNTER — Inpatient Hospital Stay
Admission: EM | Admit: 2014-11-26 | Discharge: 2014-12-03 | DRG: 871 | Disposition: A | Discharge status: Hospice | Payer: Medicare Other | Attending: Internal Medicine | Admitting: Internal Medicine

## 2014-11-26 ENCOUNTER — Encounter: Payer: Self-pay | Admitting: Emergency Medicine

## 2014-11-26 DIAGNOSIS — J441 Chronic obstructive pulmonary disease with (acute) exacerbation: Secondary | ICD-10-CM | POA: Diagnosis present

## 2014-11-26 DIAGNOSIS — A419 Sepsis, unspecified organism: Secondary | ICD-10-CM | POA: Diagnosis present

## 2014-11-26 DIAGNOSIS — Z931 Gastrostomy status: Secondary | ICD-10-CM

## 2014-11-26 DIAGNOSIS — J9621 Acute and chronic respiratory failure with hypoxia: Secondary | ICD-10-CM | POA: Diagnosis present

## 2014-11-26 DIAGNOSIS — G4733 Obstructive sleep apnea (adult) (pediatric): Secondary | ICD-10-CM | POA: Diagnosis present

## 2014-11-26 DIAGNOSIS — L089 Local infection of the skin and subcutaneous tissue, unspecified: Secondary | ICD-10-CM | POA: Diagnosis present

## 2014-11-26 DIAGNOSIS — J9622 Acute and chronic respiratory failure with hypercapnia: Secondary | ICD-10-CM | POA: Diagnosis present

## 2014-11-26 DIAGNOSIS — R21 Rash and other nonspecific skin eruption: Secondary | ICD-10-CM | POA: Diagnosis present

## 2014-11-26 DIAGNOSIS — Z66 Do not resuscitate: Secondary | ICD-10-CM | POA: Diagnosis present

## 2014-11-26 DIAGNOSIS — K219 Gastro-esophageal reflux disease without esophagitis: Secondary | ICD-10-CM | POA: Diagnosis present

## 2014-11-26 DIAGNOSIS — I2510 Atherosclerotic heart disease of native coronary artery without angina pectoris: Secondary | ICD-10-CM | POA: Diagnosis present

## 2014-11-26 DIAGNOSIS — E785 Hyperlipidemia, unspecified: Secondary | ICD-10-CM | POA: Diagnosis present

## 2014-11-26 DIAGNOSIS — R627 Adult failure to thrive: Secondary | ICD-10-CM | POA: Diagnosis present

## 2014-11-26 DIAGNOSIS — I248 Other forms of acute ischemic heart disease: Secondary | ICD-10-CM | POA: Diagnosis present

## 2014-11-26 DIAGNOSIS — Z87891 Personal history of nicotine dependence: Secondary | ICD-10-CM | POA: Diagnosis not present

## 2014-11-26 DIAGNOSIS — M109 Gout, unspecified: Secondary | ICD-10-CM | POA: Diagnosis present

## 2014-11-26 DIAGNOSIS — E440 Moderate protein-calorie malnutrition: Secondary | ICD-10-CM | POA: Diagnosis present

## 2014-11-26 DIAGNOSIS — I129 Hypertensive chronic kidney disease with stage 1 through stage 4 chronic kidney disease, or unspecified chronic kidney disease: Secondary | ICD-10-CM | POA: Diagnosis present

## 2014-11-26 DIAGNOSIS — Z515 Encounter for palliative care: Secondary | ICD-10-CM | POA: Diagnosis not present

## 2014-11-26 DIAGNOSIS — R1310 Dysphagia, unspecified: Secondary | ICD-10-CM | POA: Diagnosis present

## 2014-11-26 DIAGNOSIS — Z7982 Long term (current) use of aspirin: Secondary | ICD-10-CM | POA: Diagnosis not present

## 2014-11-26 DIAGNOSIS — R0902 Hypoxemia: Secondary | ICD-10-CM

## 2014-11-26 DIAGNOSIS — D696 Thrombocytopenia, unspecified: Secondary | ICD-10-CM | POA: Diagnosis present

## 2014-11-26 DIAGNOSIS — I5033 Acute on chronic diastolic (congestive) heart failure: Secondary | ICD-10-CM | POA: Diagnosis present

## 2014-11-26 DIAGNOSIS — N184 Chronic kidney disease, stage 4 (severe): Secondary | ICD-10-CM | POA: Diagnosis present

## 2014-11-26 DIAGNOSIS — Z85118 Personal history of other malignant neoplasm of bronchus and lung: Secondary | ICD-10-CM | POA: Diagnosis not present

## 2014-11-26 DIAGNOSIS — J449 Chronic obstructive pulmonary disease, unspecified: Secondary | ICD-10-CM | POA: Diagnosis not present

## 2014-11-26 DIAGNOSIS — R778 Other specified abnormalities of plasma proteins: Secondary | ICD-10-CM

## 2014-11-26 DIAGNOSIS — J189 Pneumonia, unspecified organism: Secondary | ICD-10-CM | POA: Diagnosis present

## 2014-11-26 DIAGNOSIS — R7989 Other specified abnormal findings of blood chemistry: Secondary | ICD-10-CM

## 2014-11-26 DIAGNOSIS — I272 Other secondary pulmonary hypertension: Secondary | ICD-10-CM | POA: Diagnosis present

## 2014-11-26 DIAGNOSIS — I270 Primary pulmonary hypertension: Secondary | ICD-10-CM | POA: Diagnosis not present

## 2014-11-26 DIAGNOSIS — J9620 Acute and chronic respiratory failure, unspecified whether with hypoxia or hypercapnia: Secondary | ICD-10-CM

## 2014-11-26 DIAGNOSIS — J90 Pleural effusion, not elsewhere classified: Secondary | ICD-10-CM

## 2014-11-26 LAB — CBC
HCT: 27.8 % — ABNORMAL LOW (ref 40.0–52.0)
Hemoglobin: 8.9 g/dL — ABNORMAL LOW (ref 13.0–18.0)
MCH: 30.8 pg (ref 26.0–34.0)
MCHC: 32.1 g/dL (ref 32.0–36.0)
MCV: 95.8 fL (ref 80.0–100.0)
Platelets: 102 10*3/uL — ABNORMAL LOW (ref 150–440)
RBC: 2.9 MIL/uL — AB (ref 4.40–5.90)
RDW: 20.3 % — ABNORMAL HIGH (ref 11.5–14.5)
WBC: 16.2 10*3/uL — AB (ref 3.8–10.6)

## 2014-11-26 LAB — COMPREHENSIVE METABOLIC PANEL
ALT: 58 U/L (ref 17–63)
ANION GAP: 11 (ref 5–15)
AST: 48 U/L — ABNORMAL HIGH (ref 15–41)
Albumin: 3 g/dL — ABNORMAL LOW (ref 3.5–5.0)
Alkaline Phosphatase: 143 U/L — ABNORMAL HIGH (ref 38–126)
BUN: 77 mg/dL — ABNORMAL HIGH (ref 6–20)
CO2: 31 mmol/L (ref 22–32)
CREATININE: 1.93 mg/dL — AB (ref 0.61–1.24)
Calcium: 9 mg/dL (ref 8.9–10.3)
Chloride: 98 mmol/L — ABNORMAL LOW (ref 101–111)
GFR calc Af Amer: 34 mL/min — ABNORMAL LOW (ref >60)
GFR calc non Af Amer: 29 mL/min — ABNORMAL LOW (ref >60)
Glucose, Bld: 90 mg/dL (ref 65–99)
Potassium: 5.4 mmol/L — ABNORMAL HIGH (ref 3.5–5.1)
SODIUM: 140 mmol/L (ref 135–145)
Total Bilirubin: 1.1 mg/dL (ref 0.3–1.2)
Total Protein: 7.9 g/dL (ref 6.5–8.1)

## 2014-11-26 LAB — BLOOD GAS, ARTERIAL
ALLENS TEST (PASS/FAIL): POSITIVE — AB
Acid-Base Excess: 8.8 mmol/L — ABNORMAL HIGH (ref 0.0–3.0)
BICARBONATE: 34.1 meq/L — AB (ref 21.0–28.0)
Drawn by: 426181
FIO2: 0.3 %
O2 Saturation: 93.3 %
PCO2 ART: 48 mmHg (ref 32.0–48.0)
PH ART: 7.46 — AB (ref 7.350–7.450)
PO2 ART: 64 mmHg — AB (ref 83.0–108.0)
Patient temperature: 37

## 2014-11-26 LAB — CBC WITH DIFFERENTIAL/PLATELET
BASOS PCT: 0 %
Basophils Absolute: 0 10*3/uL (ref 0–0.1)
Eosinophils Absolute: 0 10*3/uL (ref 0–0.7)
Eosinophils Relative: 0 %
HEMATOCRIT: 28.5 % — AB (ref 40.0–52.0)
Hemoglobin: 9.1 g/dL — ABNORMAL LOW (ref 13.0–18.0)
LYMPHS ABS: 0.4 10*3/uL — AB (ref 1.0–3.6)
LYMPHS PCT: 2 %
MCH: 30.3 pg (ref 26.0–34.0)
MCHC: 31.9 g/dL — AB (ref 32.0–36.0)
MCV: 94.9 fL (ref 80.0–100.0)
MONO ABS: 0.5 10*3/uL (ref 0.2–1.0)
MONOS PCT: 3 %
Neutro Abs: 16.3 10*3/uL — ABNORMAL HIGH (ref 1.4–6.5)
Neutrophils Relative %: 95 %
Platelets: 117 10*3/uL — ABNORMAL LOW (ref 150–440)
RBC: 3 MIL/uL — ABNORMAL LOW (ref 4.40–5.90)
RDW: 20.2 % — ABNORMAL HIGH (ref 11.5–14.5)
WBC: 17.3 10*3/uL — ABNORMAL HIGH (ref 3.8–10.6)

## 2014-11-26 LAB — URINALYSIS COMPLETE WITH MICROSCOPIC (ARMC ONLY)
Bacteria, UA: NONE SEEN
Bilirubin Urine: NEGATIVE
GLUCOSE, UA: NEGATIVE mg/dL
HGB URINE DIPSTICK: NEGATIVE
KETONES UR: NEGATIVE mg/dL
Leukocytes, UA: NEGATIVE
Nitrite: NEGATIVE
PH: 8 (ref 5.0–8.0)
Protein, ur: NEGATIVE mg/dL
Specific Gravity, Urine: 1.012 (ref 1.005–1.030)

## 2014-11-26 LAB — LIPASE, BLOOD: Lipase: 46 U/L (ref 22–51)

## 2014-11-26 LAB — CREATININE, SERUM
Creatinine, Ser: 1.8 mg/dL — ABNORMAL HIGH (ref 0.61–1.24)
GFR calc Af Amer: 37 mL/min — ABNORMAL LOW (ref >60)
GFR, EST NON AFRICAN AMERICAN: 32 mL/min — AB (ref >60)

## 2014-11-26 LAB — GLUCOSE, CAPILLARY: Glucose-Capillary: 100 mg/dL — ABNORMAL HIGH (ref 70–99)

## 2014-11-26 LAB — TROPONIN I: Troponin I: 0.85 ng/mL — ABNORMAL HIGH (ref <0.031)

## 2014-11-26 LAB — BRAIN NATRIURETIC PEPTIDE: B Natriuretic Peptide: 1995 pg/mL — ABNORMAL HIGH (ref 0.0–100.0)

## 2014-11-26 LAB — PROTIME-INR
INR: 1.43
Prothrombin Time: 17.6 seconds — ABNORMAL HIGH (ref 11.4–15.0)

## 2014-11-26 LAB — FIBRIN DERIVATIVES D-DIMER (ARMC ONLY): Fibrin derivatives D-dimer (ARMC): 4381 — ABNORMAL HIGH (ref 0–499)

## 2014-11-26 LAB — LACTIC ACID, PLASMA: Lactic Acid, Venous: 1.9 mmol/L (ref 0.5–2.0)

## 2014-11-26 LAB — APTT: APTT: 30 s (ref 24–36)

## 2014-11-26 MED ORDER — SPIRONOLACTONE 25 MG PO TABS
25.0000 mg | ORAL_TABLET | Freq: Every day | ORAL | Status: DC
  Administered 2014-11-26 – 2014-12-02 (×7): 25 mg via ORAL
  Filled 2014-11-26 (×7): qty 1

## 2014-11-26 MED ORDER — FINASTERIDE 5 MG PO TABS
5.0000 mg | ORAL_TABLET | Freq: Every day | ORAL | Status: DC
  Administered 2014-11-27 – 2014-12-03 (×7): 5 mg via ORAL
  Filled 2014-11-26 (×7): qty 1

## 2014-11-26 MED ORDER — ATORVASTATIN CALCIUM 10 MG PO TABS
10.0000 mg | ORAL_TABLET | Freq: Every day | ORAL | Status: DC
  Administered 2014-11-26 – 2014-12-03 (×8): 10 mg via ORAL
  Filled 2014-11-26 (×8): qty 1

## 2014-11-26 MED ORDER — TORSEMIDE 20 MG PO TABS
20.0000 mg | ORAL_TABLET | Freq: Every day | ORAL | Status: DC | PRN
  Administered 2014-11-26: 20 mg via ORAL
  Filled 2014-11-26: qty 1

## 2014-11-26 MED ORDER — FUROSEMIDE 10 MG/ML IJ SOLN
40.0000 mg | Freq: Once | INTRAMUSCULAR | Status: AC
  Administered 2014-11-26: 40 mg via INTRAVENOUS
  Filled 2014-11-26: qty 4

## 2014-11-26 MED ORDER — JEVITY 1.2 CAL PO LIQD: 1000.0000 mL | ORAL | Status: DC

## 2014-11-26 MED ORDER — DRONABINOL 2.5 MG PO CAPS
5.0000 mg | ORAL_CAPSULE | Freq: Two times a day (BID) | ORAL | Status: DC
  Administered 2014-11-27 – 2014-12-03 (×13): 5 mg via ORAL
  Filled 2014-11-26 (×14): qty 2

## 2014-11-26 MED ORDER — ASPIRIN 325 MG PO TABS
325.0000 mg | ORAL_TABLET | Freq: Every day | ORAL | Status: DC
  Administered 2014-11-26 – 2014-12-01 (×5): 325 mg via ORAL
  Filled 2014-11-26 (×8): qty 1

## 2014-11-26 MED ORDER — IOHEXOL 240 MG/ML SOLN
25.0000 mL | INTRAMUSCULAR | Status: AC
  Administered 2014-11-26: 25 mL via ORAL

## 2014-11-26 MED ORDER — VANCOMYCIN HCL 10 G IV SOLR
1250.0000 mg | Freq: Once | INTRAVENOUS | Status: DC
  Filled 2014-11-26: qty 1250

## 2014-11-26 MED ORDER — ONDANSETRON HCL 4 MG/2ML IJ SOLN: 4.0000 mg | Freq: Four times a day (QID) | INTRAMUSCULAR | Status: DC | PRN

## 2014-11-26 MED ORDER — ONDANSETRON HCL 4 MG PO TABS: 4.0000 mg | ORAL_TABLET | Freq: Four times a day (QID) | ORAL | Status: DC | PRN

## 2014-11-26 MED ORDER — SENNA 8.6 MG PO TABS
1.0000 | ORAL_TABLET | Freq: Two times a day (BID) | ORAL | Status: DC
  Administered 2014-11-26 – 2014-12-03 (×8): 8.6 mg via ORAL
  Filled 2014-11-26 (×13): qty 1

## 2014-11-26 MED ORDER — NITROGLYCERIN 0.4 MG/HR TD PT24
0.4000 mg | MEDICATED_PATCH | Freq: Every day | TRANSDERMAL | Status: DC
  Administered 2014-11-27 – 2014-12-03 (×6): 0.4 mg via TRANSDERMAL
  Filled 2014-11-26 (×10): qty 1

## 2014-11-26 MED ORDER — PIPERACILLIN-TAZOBACTAM 3.375 G IVPB 30 MIN
3.3750 g | Freq: Once | INTRAVENOUS | Status: AC
  Administered 2014-11-26: 3.375 g via INTRAVENOUS

## 2014-11-26 MED ORDER — PIPERACILLIN-TAZOBACTAM 3.375 G IVPB
INTRAVENOUS | Status: AC
  Filled 2014-11-26: qty 50

## 2014-11-26 MED ORDER — VANCOMYCIN HCL IN DEXTROSE 1-5 GM/200ML-% IV SOLN
INTRAVENOUS | Status: AC
  Filled 2014-11-26: qty 200

## 2014-11-26 MED ORDER — CARBOXYMETHYLCELLULOSE SODIUM 0.5 % OP SOLN
1.0000 [drp] | Freq: Two times a day (BID) | OPHTHALMIC | Status: DC | PRN
  Filled 2014-11-26: qty 15

## 2014-11-26 MED ORDER — TAMSULOSIN HCL 0.4 MG PO CAPS
0.4000 mg | ORAL_CAPSULE | Freq: Every day | ORAL | Status: DC
  Administered 2014-11-26 – 2014-12-03 (×8): 0.4 mg via ORAL
  Filled 2014-11-26 (×8): qty 1

## 2014-11-26 MED ORDER — FERROUS SULFATE 325 (65 FE) MG PO TABS
325.0000 mg | ORAL_TABLET | Freq: Two times a day (BID) | ORAL | Status: DC
  Administered 2014-11-26 – 2014-12-01 (×5): 325 mg via ORAL
  Filled 2014-11-26 (×7): qty 1

## 2014-11-26 MED ORDER — CARVEDILOL 6.25 MG PO TABS
6.2500 mg | ORAL_TABLET | Freq: Two times a day (BID) | ORAL | Status: DC
  Administered 2014-11-26 – 2014-12-03 (×11): 6.25 mg via ORAL
  Filled 2014-11-26 (×13): qty 1

## 2014-11-26 MED ORDER — SODIUM CHLORIDE 0.9 % IV BOLUS (SEPSIS)
1000.0000 mL | INTRAVENOUS | Status: AC
  Administered 2014-11-26 (×2): 1000 mL via INTRAVENOUS

## 2014-11-26 MED ORDER — ACETAMINOPHEN 650 MG RE SUPP
650.0000 mg | Freq: Four times a day (QID) | RECTAL | Status: DC | PRN
Start: 2014-11-26 — End: 2014-12-03

## 2014-11-26 MED ORDER — JEVITY 1.2 CAL PO LIQD
237.0000 mL | Freq: Every day | ORAL | Status: DC
  Administered 2014-11-26 – 2014-12-02 (×8): 237 mL
  Filled 2014-11-26: qty 237

## 2014-11-26 MED ORDER — ALLOPURINOL 100 MG PO TABS
100.0000 mg | ORAL_TABLET | Freq: Two times a day (BID) | ORAL | Status: DC
  Administered 2014-11-26 – 2014-12-03 (×14): 100 mg via ORAL
  Filled 2014-11-26 (×14): qty 1

## 2014-11-26 MED ORDER — VANCOMYCIN HCL 10 G IV SOLR
1250.0000 mg | INTRAVENOUS | Status: DC
  Administered 2014-11-27 – 2014-12-01 (×4): 1250 mg via INTRAVENOUS
  Filled 2014-11-26 (×4): qty 1250

## 2014-11-26 MED ORDER — SODIUM CHLORIDE 0.9 % IV SOLN
INTRAVENOUS | Status: DC
  Administered 2014-11-26: 17:00:00 via INTRAVENOUS

## 2014-11-26 MED ORDER — VANCOMYCIN HCL IN DEXTROSE 1-5 GM/200ML-% IV SOLN
1000.0000 mg | Freq: Once | INTRAVENOUS | Status: AC
  Administered 2014-11-26: 1000 mg via INTRAVENOUS

## 2014-11-26 MED ORDER — JEVITY 1.2 CAL PO LIQD
480.0000 mL | Freq: Three times a day (TID) | ORAL | Status: DC
  Administered 2014-11-26 – 2014-11-27 (×2): 480 mL
  Administered 2014-11-27 (×2): 474 mL
  Administered 2014-11-28 – 2014-12-03 (×17): 480 mL
  Filled 2014-11-26: qty 711

## 2014-11-26 MED ORDER — CLOPIDOGREL BISULFATE 75 MG PO TABS
75.0000 mg | ORAL_TABLET | Freq: Every day | ORAL | Status: DC
  Administered 2014-11-26 – 2014-12-03 (×8): 75 mg via ORAL
  Filled 2014-11-26 (×8): qty 1

## 2014-11-26 MED ORDER — PIPERACILLIN-TAZOBACTAM 3.375 G IVPB
3.3750 g | Freq: Three times a day (TID) | INTRAVENOUS | Status: DC
  Administered 2014-11-26 – 2014-12-03 (×19): 3.375 g via INTRAVENOUS
  Filled 2014-11-26 (×23): qty 50

## 2014-11-26 MED ORDER — DOCUSATE SODIUM 100 MG PO CAPS: 100.0000 mg | ORAL_CAPSULE | Freq: Two times a day (BID) | ORAL | Status: DC

## 2014-11-26 MED ORDER — ACETAMINOPHEN 325 MG PO TABS
650.0000 mg | ORAL_TABLET | Freq: Four times a day (QID) | ORAL | Status: DC | PRN
Start: 2014-11-26 — End: 2014-12-03
  Filled 2014-11-26 (×2): qty 2

## 2014-11-26 MED ORDER — IOHEXOL 240 MG/ML SOLN: 25.0000 mL | Freq: Once | INTRAMUSCULAR | Status: DC | PRN

## 2014-11-26 MED ORDER — HEPARIN SODIUM (PORCINE) 5000 UNIT/ML IJ SOLN
5000.0000 [IU] | Freq: Three times a day (TID) | INTRAMUSCULAR | Status: DC
  Administered 2014-11-26 – 2014-12-03 (×21): 5000 [IU] via SUBCUTANEOUS
  Filled 2014-11-26 (×21): qty 1

## 2014-11-26 MED ORDER — PANTOPRAZOLE SODIUM 40 MG PO TBEC
40.0000 mg | DELAYED_RELEASE_TABLET | Freq: Every day | ORAL | Status: DC
  Administered 2014-11-30 – 2014-12-01 (×2): 40 mg via ORAL
  Filled 2014-11-26 (×3): qty 1

## 2014-11-26 NOTE — Progress Notes (Signed)
Notified Dr. Posey Pronto that BNP is >19,000 and asked if IVF needed to be continued. Dr Posey Pronto gave verbal order to d/c fluids at this time.

## 2014-11-26 NOTE — Progress Notes (Signed)
   11/26/14 1700  Clinical Encounter Type  Visited With Patient and family together  Visit Type Initial;Spiritual support;Other (Comment)  Referral From Nurse  Consult/Referral To Chaplain  Spiritual Encounters  Spiritual Needs Other (Comment)  Stress Factors  Patient Stress Factors None identified  Family Stress Factors None identified  Chaplain received an order in system stating that patient would appreciate a visit. Went by to visit patient and family will try to follow up and refer to other unit chaplain. Offered services and support as applicable. Chaplain Jermayne Sweeney A. Tatiyanna Lashley Ext. [phone]

## 2014-11-26 NOTE — ED Notes (Signed)
Patient transported to CT 

## 2014-11-26 NOTE — ED Notes (Signed)
Ems pt home, approx 3 am having cold chills, ems reports a 103.6 temp , verbally unresponsive, , G-tube intact ,

## 2014-11-26 NOTE — ED Notes (Signed)
Called  Code  Sepsis  Spoke  With  [place] am

## 2014-11-26 NOTE — Progress Notes (Signed)
Pt. admitted to unit. Oriented to room, call bell, Ascom phones and staff. Bed in low position. Fall safety plan reviewed. Full assessment to Epic. Will continue to monitor.

## 2014-11-26 NOTE — Progress Notes (Signed)
Initial Nutrition Assessment  DOCUMENTATION CODES:     INTERVENTION:  Coordination of care: Dr Posey Pronto wanting to restart tube feeding..  Recommend jevity 1.2 bolus feeding 7 cans per day. Will provide 1990 kcals, 100 gm of protein and 1370m  Free water  NUTRITION DIAGNOSIS:  Reassess in am  GOAL:    Goal would be for pt to tolerate tube feeding at goal rate  MONITOR:   Enteral nutrition  Electrolyte and renal profile Digestive system  REASON FOR ASSESSMENT:  Consult Enteral/tube feeding initiation and management  ASSESSMENT:  Pt admitted with AMS, pneumonia, bilateral pleural effusion and sepsis  PMhx: chronic dysphagia recent PEG tube placement, COPD, CHF with EF of 30%, CKD, GERD, lung cancer  Unable to speak with pt and wife just up to unit  Electrolyte and Renal Profile:    Recent Labs Lab 11/26/14 0910  BUN 77*  CREATININE 1.93*  NA 140  K 5.4*    Protein Profile:  Recent Labs Lab 11/26/14 0910  ALBUMIN 3.0*    Height:  Ht Readings from Last 1 Encounters:  11/26/14 '5\' 11"'$  (1.803 m)    Weight:  Wt Readings from Last 1 Encounters:  11/26/14 189 lb 6.4 oz (85.911 kg)      Wt Readings from Last 10 Encounters:  11/26/14 189 lb 6.4 oz (85.911 kg)  10/02/14 173 lb (78.472 kg)    BMI:  Body mass index is 26.43 kg/(m^2).  Estimated Nutritional Needs:  Kcal:  1547 kcals (IF 1.0-1.2, AF 1.3) 24967-5916kcals/d.   Protein:  (1.2-1.5 g/kg) 103-129 gm/d  Fluid:  (25-367mkg) 2150-25806m  Skin:  Reviewed, no issues  Diet Order:  Diet NPO time specified  EDUCATION NEEDS:   None at this time    High level of care  Kailia Starry B. AllZenia ResidesD,[place])

## 2014-11-26 NOTE — Progress Notes (Signed)
ANTIBIOTIC CONSULT NOTE - INITIAL  Pharmacy Consult for Vancomycin and Zosyn Indication: pneumonia  No Known Allergies  Patient Measurements: Height: '5\' 11"'$  (180.3 cm) Weight: 189 lb 6.4 oz (85.911 kg) IBW/kg (Calculated) : 75.3 Adjusted Body Weight:   Vital Signs: Temp: 100.6 F (38.1 C) (05/10 0902) Temp Source: Rectal (05/10 0902) BP: 131/76 mmHg (05/10 1523) Pulse Rate: 89 (05/10 1523) Intake/Output from previous day:   Intake/Output from this shift:    Labs:  Recent Labs  11/26/14 0910  WBC 17.3*  HGB 9.1*  PLT 117*  CREATININE 1.93*   Estimated Creatinine Clearance: 27.6 mL/min (by C-G formula based on Cr of 1.93). No results for input(s): VANCOTROUGH, VANCOPEAK, VANCORANDOM, GENTTROUGH, GENTPEAK, GENTRANDOM, TOBRATROUGH, TOBRAPEAK, TOBRARND, AMIKACINPEAK, AMIKACINTROU, AMIKACIN in the last 72 hours.   Microbiology: Recent Results (from the past 720 hour(s))  Blood Culture (routine x 2)     Status: None (Preliminary result)   Collection Time: 11/26/14  9:10 AM  Result Value Ref Range Status   Specimen Description BLOOD  Final   Special Requests NONE  Final   Culture NO GROWTH <12 HOURS  Final   Report Status PENDING  Incomplete  Blood Culture (routine x 2)     Status: None (Preliminary result)   Collection Time: 11/26/14  9:12 AM  Result Value Ref Range Status   Specimen Description BLOOD  Final   Special Requests NONE  Final   Culture NO GROWTH <12 HOURS  Final   Report Status PENDING  Incomplete    Medical History: Past Medical History  Diagnosis Date  . CHF (congestive heart failure)   . Dysphagia   . Anemia   . Hypothyroid   . Hypertension   . Enlarged prostate   . Chronic kidney disease   . Arthritis   . Hyperlipidemia   . GERD (gastroesophageal reflux disease)   . Gout   . Lung cancer   . Obstructive sleep apnea     Medications:  Scheduled:  . docusate sodium  100 mg Oral BID  . heparin  5,000 Units Subcutaneous 3 times per day   . senna  1 tablet Oral BID   Assessment: Pharmacy consulted to dose vancomycin and zosyn in this 81yo M being treated for possible PNA  Goal of Therapy:  Vancomycin trough level 15-20 mcg/ml  Plan:  Zosyn 3.375gm IV Q8hrs ordered as EI. Vancomycin '1250mg'$  IV once ordered, then vancomycin '1250mg'$  IV Q36hrs ordered to start 12 hours after first dose for stacked dosing. Trough ordered for 5/15 at 1730hrs which will be close to steadystate. Renal function is worsening, may need to adjust dose if continues.  Measure antibiotic drug levels at steady state Follow up culture results  Andria Rhein, Pharm D., BCPS 11/26/2014

## 2014-11-26 NOTE — ED Notes (Signed)
Critical Troponin reported to Dr. Joni Fears. Troponin 0.85.

## 2014-11-26 NOTE — ED Provider Notes (Addendum)
[facility] Emergency Department Provider Note  ____________________________________________  Time seen: On arrival at 8:50 AM  I have reviewed the triage vital signs and the nursing notes. History Limited by altered mental status HISTORY  Chief Complaint Code Sepsis    HPI Mathew Morris is a 81 y.o. male who is brought to the ED by ambulance from home for altered mental status and fever and a rash on his body. EMS relates that the patient was seen in GI clinic a few days ago and noted to have an infection around his PEG tube inserted on Bactrim. Per EMS, oral temperature was 103.6. Patient has had a productive cough for the last 2 days. Per EMS and the family, the patient has no identifiable pain at this time. No vomiting or diarrhea.     Past Medical History  Diagnosis Date  . CHF (congestive heart failure)   . Dysphagia   . Anemia   . Hypothyroid   . Hypertension   . Enlarged prostate   . Chronic kidney disease   . Arthritis   . Hyperlipidemia   . GERD (gastroesophageal reflux disease)   . Gout   . Lung cancer   . Obstructive sleep apnea     Patient Active Problem List   Diagnosis Date Noted  . Chronic systolic heart failure 10/96/0454  . Orthostatic hypotension 11/12/2014  . Dysphagia 11/12/2014    Past Surgical History  Procedure Laterality Date  . Cardiac catheterization    . Lobectomy Left   . Thyroidectomy    . Bilateral carpal tunnel release Bilateral     Current Outpatient Rx  Name  Route  Sig  Dispense  Refill  . allopurinol (ZYLOPRIM) 100 MG tablet   Oral   Take 100 mg by mouth 2 (two) times daily.         Marland Kitchen aspirin 325 MG tablet   Oral   Take 325 mg by mouth daily.         Marland Kitchen atorvastatin (LIPITOR) 10 MG tablet   Oral   Take 10 mg by mouth daily.         . carboxymethylcellulose (REFRESH PLUS) 0.5 % SOLN   Both Eyes   Place 1 drop into both eyes 2 (two) times daily as needed.         . carvedilol (COREG)  6.25 MG tablet   Oral   Take 6.25 mg by mouth 2 (two) times daily with a meal.         . clopidogrel (PLAVIX) 75 MG tablet   Oral   Take 75 mg by mouth daily.         . ferrous sulfate 325 (65 FE) MG tablet   Oral   Take 325 mg by mouth 2 (two) times daily with a meal.         . finasteride (PROSCAR) 5 MG tablet   Oral   Take 5 mg by mouth daily.         . nitroGLYCERIN (NITRODUR - DOSED IN MG/24 HR) 0.4 mg/hr patch   Transdermal   Place 0.4 mg onto the skin daily.         . nitroGLYCERIN (NITROSTAT) 0.4 MG SL tablet   Sublingual   Place 0.4 mg under the tongue every 5 (five) minutes as needed for chest pain.         . potassium chloride (KLOR-CON) 20 MEQ packet   Oral   Take 10 mEq by mouth daily.         Marland Kitchen  spironolactone (ALDACTONE) 25 MG tablet   Oral   Take 25 mg by mouth daily.         Marland Kitchen sulfamethoxazole-trimethoprim (BACTRIM DS,SEPTRA DS) 800-160 MG per tablet   Oral   Take 1 tablet by mouth daily. Give dose after Hemodialysis days         . tamsulosin (FLOMAX) 0.4 MG CAPS capsule   Oral   Take 0.4 mg by mouth daily.         Marland Kitchen torsemide (DEMADEX) 20 MG tablet   Oral   Take 20 mg by mouth daily as needed (for edema or weight gain).         . ciprofloxacin (CIPRO) 250 MG tablet   Oral   Take 250 mg by mouth 2 (two) times daily.         Marland Kitchen dexlansoprazole (DEXILANT) 60 MG capsule   Oral   Take 60 mg by mouth daily.         Marland Kitchen dronabinol (MARINOL) 5 MG capsule   Oral   Take 5 mg by mouth 2 (two) times daily before a meal.         . HYDROcodone-acetaminophen (NORCO/VICODIN) 5-325 MG per tablet   Oral   Take 1 tablet by mouth every 6 (six) hours as needed for moderate pain.         . isosorbide mononitrate (IMDUR) 120 MG 24 hr tablet   Oral   Take 120 mg by mouth daily as needed (for chest pain).         . ramipril (ALTACE) 2.5 MG capsule   Oral   Take 2.5 mg by mouth daily.           Allergies Review of patient's  allergies indicates no known allergies.  Family History  Problem Relation Age of Onset  . Stomach cancer Father   . Breast cancer Sister   . COPD Sister     Social History History  Substance Use Topics  . Smoking status: Former Smoker    Quit date: 11/12/1983  . Smokeless tobacco: Never Used  . Alcohol Use: No    Review of Systems  Constitutional: Fever Eyes:No blurry vision or double vision.  ENT: No sore throat. Cardiovascular: No chest pain. Respiratory: Productive cough Gastrointestinal: PEG tube. No vomiting or diarrhea  No BRBPR or melena. Genitourinary: Negative for dysuria, urinary retention, bloody urine, or difficulty urinating. Musculoskeletal: Negative for back pain. No joint swelling or pain. Skin: Rash in the bilateral lower abdomen and groin Neurological: Negative for headaches, focal weakness or numbness. Psychiatric:No anxiety or depression.   Endocrine:No hot/cold intolerance, changes in energy, or sleep difficulty.  10-point ROS otherwise negative.  ____________________________________________   PHYSICAL EXAM:  VITAL SIGNS: ED Triage Vitals  Enc Vitals Group     BP 11/26/14 0930 130/74 mmHg     Pulse Rate 11/26/14 0902 104     Resp 11/26/14 0902 24     Temp 11/26/14 0902 100.6 F (38.1 C)     Temp Source 11/26/14 0902 Rectal     SpO2 11/26/14 0857 94 %     Weight 11/26/14 0902 189 lb (85.73 kg)     Height 11/26/14 0902 '6\' 1"'$  (1.854 m)     Head Cir --      Peak Flow --      Pain Score --      Pain Loc --      Pain Edu? --      Excl. in [place]? --  Constitutional: Alert and oriented. Moderate distress. Eyes: No scleral icterus. No conjunctival pallor. PERRL. EOMI ENT   Head: Normocephalic and atraumatic.   Nose: No congestion/rhinnorhea. No septal hematoma   Mouth/Throat: Dry mucous membranes, no pharyngeal erythema, no oral lesions   Neck: No stridor. No SubQ emphysema.  Hematological/Lymphatic/Immunilogical: No  cervical lymphadenopathy. Cardiovascular: Tachycardia to 105. Normal and symmetric distal pulses are present in all extremities. No murmurs, rubs, or gallops. Respiratory: Tachypnea, bilateral rhonchi  Gastrointestinal: Soft and nontender. No distention. There is no CVA tenderness.  No rebound, rigidity, or guarding. Mild erythema around PEG tube stoma site, without induration or drainage Genitourinary: Rectal exam unremarkable, Hemoccult negative and brown stool Musculoskeletal: Nontender with normal range of motion in all extremities. No joint effusions.  No lower extremity tenderness. 1+ edema diffusely  Neurologic:   CN 2-10 normal. Motor grossly intact. No pronator drift.  Normal gait. No gross focal neurologic deficits are appreciated.  Skin:  Skin is hot to touch. There is a dark, erythematous, confluent petechial rash in the inguinal folds all over the body including in the groin, bilateral axillae, and under the breasts. The fingers are also a dark red color, and the distal half of the lateral feet and toes, also exhibit this dark red, almost purplish rash.  ____________________________________________    LABS (pertinent positives/negatives) (all labs ordered are listed, but only abnormal results are displayed) Labs Reviewed  CBC WITH DIFFERENTIAL/PLATELET - Abnormal; Notable for the following:    WBC 17.3 (*)    RBC 3.00 (*)    Hemoglobin 9.1 (*)    HCT 28.5 (*)    MCHC 31.9 (*)    RDW 20.2 (*)    Platelets 117 (*)    Neutro Abs 16.3 (*)    Lymphs Abs 0.4 (*)    All other components within normal limits  COMPREHENSIVE METABOLIC PANEL - Abnormal; Notable for the following:    Potassium 5.4 (*)    Chloride 98 (*)    BUN 77 (*)    Creatinine, Ser 1.93 (*)    Albumin 3.0 (*)    AST 48 (*)    Alkaline Phosphatase 143 (*)    GFR calc non Af Amer 29 (*)    GFR calc Af Amer 34 (*)    All other components within normal limits  TROPONIN I - Abnormal; Notable for the  following:    Troponin I 0.85 (*)    All other components within normal limits  PROTIME-INR - Abnormal; Notable for the following:    Prothrombin Time 17.6 (*)    All other components within normal limits  BLOOD GAS, ARTERIAL - Abnormal; Notable for the following:    pH, Arterial 7.46 (*)    pO2, Arterial 64 (*)    Bicarbonate 34.1 (*)    Acid-Base Excess 8.8 (*)    Allens test (pass/fail) POSITIVE (*)    All other components within normal limits  URINALYSIS COMPLETEWITH MICROSCOPIC ([facility])  - Abnormal; Notable for the following:    Color, Urine YELLOW (*)    APPearance CLEAR (*)    Squamous Epithelial / LPF 0-5 (*)    All other components within normal limits  FIBRIN DERIVATIVES D-DIMER [facility]) - Abnormal; Notable for the following:    Fibrin derivatives D-dimer ([facility]) 4381 (*)    All other components within normal limits  CULTURE, BLOOD (ROUTINE X 2)  CULTURE, BLOOD (ROUTINE X 2)  URINE CULTURE  CULTURE, EXPECTORATED SPUTUM-ASSESSMENT  APTT  LACTIC ACID, PLASMA  LIPASE, BLOOD  LACTIC ACID, PLASMA  FIBRIN DEGRADATION PROD.([facility])   ____________________________________________   EKG  Sinus tachycardia, rate 103, left axis, normal intervals, right bundle branch block. No ST segment changes, normal T waves.  ____________________________________________    RADIOLOGY  Bibasilar opacity worsened on the left consistent with consolidation from pneumonia CT chest, abdomen, pelvis pending ____________________________________________   PROCEDURES CRITICAL CARE Performed by: Joni Fears, Lainie Daubert   Total critical care time: 35 minutes  Critical care time was exclusive of separately billable procedures and treating other patients.  Critical care was necessary to treat or prevent imminent or life-threatening deterioration.  Critical care was time spent personally by me on the following activities: development of treatment plan with patient and/or surrogate as well as  nursing, discussions with consultants, evaluation of patient's response to treatment, examination of patient, obtaining history from patient or surrogate, ordering and performing treatments and interventions, ordering and review of laboratory studies, ordering and review of radiographic studies, pulse oximetry and re-evaluation of patient's condition.  ____________________________________________   INITIAL IMPRESSION / ASSESSMENT AND PLAN / ED COURSE  Pertinent labs & imaging results that were available during my care of the patient were reviewed by me and considered in my medical decision making (see chart for details).  The patient presents with SIRS criteria with fever, tachycardia, tachypnea, and room air hypoxia. I suspect pneumonia at this time, however, with his recent concerns for a infection about the PEG tube and the rash that is suspicious for bacteremia or DIC, we'll start him on vancomycin and Zosyn and plan to get CT scans of the chest, abdomen, pelvis for further investigation of possible infectious sources. Resuscitated with IV saline boluses and check labs including fibrin split products and d-dimer to look for evidence of DIC. The patient's family arrive shortly after the patient did, and all care was discussed with them. ----------------------------------------- 2:07 PM on 11/26/2014 ----------------------------------------- Blood pressures remained stable in the ED. Oxygenation is adequate on 3 L nasal cannula. Plan to admit the patient for further workup and management. He does have a leukocytosis, which is likely due to the pneumonia. His troponin is elevated at 0.85, but currently there is not any strong evidence of a myocardial infarction, and I suspect this is due to the acute stress from his sepsis. We will defer anticoagulation or other cardiac interventions at this time patient's care was discussed with hospitalist, Dr. Lydia Guiles at 2:10 PM. Will follow up with the CT  scans and moved forward with admission.  ____________________________________________   FINAL CLINICAL IMPRESSION(S) / ED DIAGNOSES  Final diagnoses:  None   sepsis due to community-acquired pneumonia Acute respiratory failure    Carrie Mew, MD 11/26/14 [place], MD 11/26/14 [id number]

## 2014-11-26 NOTE — ED Notes (Signed)
Patient is resting comfortably. Family at bedside.  Will continue to monitor.

## 2014-11-26 NOTE — H&P (Signed)
[facility] NAME: Mathew Morris    MR#:  [id number]  DATE OF BIRTH:  July 02, 1924  DATE OF ADMISSION:  11/26/2014  PRIMARY CARE PHYSICIAN: Maryland Pink, MD   REQUESTING/REFERRING PHYSICIAN: Dr Joni Fears  CHIEF COMPLAINT:  Altered mental status and fever 103.6 at home  HISTORY OF PRESENT ILLNESS:  [name]  is a 81 y.o. male with a known history of history of COPD stage II chronic dysphagia status post gastrostomy tube moderate malnutrition ,, small cell lung cancer with resection history of congestive heart failure with severe systolic cardiac myopathy EF of 30% comes to the emergency room after he was found to have altered mental status at home per wife. Patient was noted by EMS to have fever of 103.6. Patient is a chronically on 2 L nasal canal oxygen tip at present he is on 4 L nasal cannula oxygen with sats in the 90s.  In the emergency room, patient was found to be hypoxic with a PaO2 of 64 chest x-ray shows bilateral pneumonia with bilateral pleural effusion. This was confirmed with CT of the chest as well. Patient received vancomycin and IV Zosyn.  Patient was also found to have bilateral upper and lower extremity swelling with significant beefy-looking rash. He was recently started on Bactrim because of discharge around the PEG tube site for a GI clinic. No known history of allergic reaction to sulfa. Patient is being admitted for further eval and management. [place]. CODE STATUS confirmed with patient's wife was the healthcare provider  patient is a full code.  PAST MEDICAL HISTORY:   Past Medical History  Diagnosis Date  . CHF (congestive heart failure)   . Dysphagia   . Anemia   . Hypothyroid   . Hypertension   . Enlarged prostate   . Chronic kidney disease   . Arthritis   . Hyperlipidemia   . GERD (gastroesophageal reflux disease)   . Gout   . Lung cancer   . Obstructive sleep apnea     PAST SURGICAL  HISTOIRY:   Past Surgical History  Procedure Laterality Date  . Cardiac catheterization    . Lobectomy Left   . Thyroidectomy    . Bilateral carpal tunnel release Bilateral     SOCIAL HISTORY:   History  Substance Use Topics  . Smoking status: Former Smoker    Quit date: 11/12/1983  . Smokeless tobacco: Never Used  . Alcohol Use: No    FAMILY HISTORY:   Family History  Problem Relation Age of Onset  . Stomach cancer Father   . Breast cancer Sister   . COPD Sister     DRUG ALLERGIES:  No Known Allergies  REVIEW OF SYSTEMS:  Review of Systems  Constitutional: Positive for fever and chills.  HENT: Negative for ear discharge.   Eyes: Negative for blurred vision and double vision.  Respiratory: Positive for cough, sputum production and shortness of breath.   Cardiovascular: Negative for chest pain, palpitations and orthopnea.  Gastrointestinal: Negative for heartburn, nausea and vomiting.  Genitourinary: Negative for urgency and hematuria.  Musculoskeletal: Negative for myalgias, back pain and neck pain.  Skin: Positive for rash. Negative for itching.  Neurological: Negative for dizziness, tingling, tremors, focal weakness, weakness and headaches.  Psychiatric/Behavioral: Negative for depression. The patient is not nervous/anxious.   All other systems reviewed and are negative.    MEDICATIONS AT HOME:   Prior to Admission medications   Medication Sig Start Date  End Date Taking? Authorizing Provider  allopurinol (ZYLOPRIM) 100 MG tablet Take 100 mg by mouth 2 (two) times daily.   Yes Historical Provider, MD  aspirin 325 MG tablet Take 325 mg by mouth daily.   Yes Historical Provider, MD  atorvastatin (LIPITOR) 10 MG tablet Take 10 mg by mouth daily.   Yes Historical Provider, MD  carboxymethylcellulose (REFRESH PLUS) 0.5 % SOLN Place 1 drop into both eyes 2 (two) times daily as needed.   Yes Historical Provider, MD  carvedilol (COREG) 6.25 MG tablet Take 6.25 mg by  mouth 2 (two) times daily with a meal.   Yes Historical Provider, MD  clopidogrel (PLAVIX) 75 MG tablet Take 75 mg by mouth daily.   Yes Historical Provider, MD  ferrous sulfate 325 (65 FE) MG tablet Take 325 mg by mouth 2 (two) times daily with a meal.   Yes Historical Provider, MD  finasteride (PROSCAR) 5 MG tablet Take 5 mg by mouth daily.   Yes Historical Provider, MD  nitroGLYCERIN (NITRODUR - DOSED IN MG/24 HR) 0.4 mg/hr patch Place 0.4 mg onto the skin daily.   Yes Historical Provider, MD  nitroGLYCERIN (NITROSTAT) 0.4 MG SL tablet Place 0.4 mg under the tongue every 5 (five) minutes as needed for chest pain.   Yes Historical Provider, MD  potassium chloride (KLOR-CON) 20 MEQ packet Take 10 mEq by mouth daily.   Yes Historical Provider, MD  spironolactone (ALDACTONE) 25 MG tablet Take 25 mg by mouth daily.   Yes Historical Provider, MD  sulfamethoxazole-trimethoprim (BACTRIM DS,SEPTRA DS) 800-160 MG per tablet Take 1 tablet by mouth daily. Give dose after Hemodialysis days   Yes Historical Provider, MD  tamsulosin (FLOMAX) 0.4 MG CAPS capsule Take 0.4 mg by mouth daily.   Yes Historical Provider, MD  torsemide (DEMADEX) 20 MG tablet Take 20 mg by mouth daily as needed (for edema or weight gain).   Yes Historical Provider, MD  dexlansoprazole (DEXILANT) 60 MG capsule Take 60 mg by mouth daily.    Historical Provider, MD  dronabinol (MARINOL) 5 MG capsule Take 5 mg by mouth 2 (two) times daily before a meal.    Historical Provider, MD  HYDROcodone-acetaminophen (NORCO/VICODIN) 5-325 MG per tablet Take 1 tablet by mouth every 6 (six) hours as needed for moderate pain.    Historical Provider, MD  isosorbide mononitrate (IMDUR) 120 MG 24 hr tablet Take 120 mg by mouth daily as needed (for chest pain).    Historical Provider, MD  ramipril (ALTACE) 2.5 MG capsule Take 2.5 mg by mouth daily.    Historical Provider, MD      VITAL SIGNS:  Blood pressure 130/79, pulse 86, temperature 100.6 F (38.1  C), temperature source Rectal, resp. rate 30, height '5\' 11"'$  (1.803 m), weight 85.911 kg (189 lb 6.4 oz), SpO2 92 %.  PHYSICAL EXAMINATION:  GENERAL:  81 y.o.-year-old patient lying in the bed with no acute distress.  EYES: Pupils equal, round, reactive to light and accommodation. No scleral icterus. Extraocular muscles intact.  HEENT: Head atraumatic, normocephalic. Oropharynx and nasopharynx clear.  NECK:  Supple, no jugular venous distention. No thyroid enlargement, no tenderness.  LUNGS: decreased bs bases no wheezing, rales,rhonchi or crepitation. No use of accessory muscles of respiration.  CARDIOVASCULAR: S1, S2 normal. No murmurs, rubs, or gallops.  ABDOMEN: Soft, nontender, nondistended. Bowel sounds present. No organomegaly or mass.  EXTREMITIES: No pedal edema, cyanosis, or clubbing.  NEUROLOGIC: Cranial nerves II through XII are intact. Muscle strength 5/5 in  all extremities. Sensation intact. Gait not checked.  PSYCHIATRIC: The patient is alert and oriented x 3.  SKIN: bilateral hands and feet beefy looking macular rash, non blanching.  LABORATORY PANEL:   CBC  Recent Labs Lab 11/26/14 0910  WBC 17.3*  HGB 9.1*  HCT 28.5*  PLT 117*   ------------------------------------------------------------------------------------------------------------------  Chemistries   Recent Labs Lab 11/26/14 0910  NA 140  K 5.4*  CL 98*  CO2 31  GLUCOSE 90  BUN 77*  CREATININE 1.93*  CALCIUM 9.0  AST 48*  ALT 58  ALKPHOS 143*  BILITOT 1.1   ------------------------------------------------------------------------------------------------------------------  Cardiac Enzymes  Recent Labs Lab 11/26/14 0910  TROPONINI 0.85*   ------------------------------------------------------------------------------------------------------------------  RADIOLOGY:  Ct Abdomen Pelvis Wo Contrast  11/26/2014   CLINICAL DATA:  81 year old male with sepsis, fever of unknown origin, chronic  kidney disease. Initial encounter.  EXAM: CT CHEST, ABDOMEN AND PELVIS WITHOUT CONTRAST  TECHNIQUE: Multidetector CT imaging of the chest, abdomen and pelvis was performed following the standard protocol without IV contrast.  COMPARISON:  Chest CT 01/24/2014. Renal ultrasound 10/12/2014. Abdomen MRI 10/17/2006. Chest CT 06/10/2014.  FINDINGS: CT CHEST FINDINGS  Moderate bilateral pleural effusions, layering on the right and at least partially loculated on the left. Superimposed widespread pulmonary septal thickening, patchy peribronchovascular ground-glass opacity in the upper lobes, and lower lobe peribronchial thickening and some lower lobe consolidation worse on the right. Major airways are patent. The appearance of the lungs and pleura is very similar to that in July 2015, but was resolved on the intervening 06/10/2014 chest CT.  Incidental intravenous gas at the right subclavian and IJ. Cardiomegaly with aortic and coronary artery calcified plaque. No pericardial effusion.  Degenerative changes at both sternoclavicular joints appear stable. Mediastinal lymph nodes are mildly increased and appear reactive in nature, including an 11 mm prevascular node on series 4, image 20.  Diffuse degenerative changes and widespread ankylosis in the spine. Bulky ste lower cervical and thoracic flowing osteophytes. No acute osseous abnormality identified.  CT ABDOMEN AND PELVIS FINDINGS  Lower thoracic and lumbar flowing osteophytes with widespread ankylosis. Superimposed advanced lumbar disc and endplate degeneration. Partial ankylosis of both SI joints. Moderate to severe degenerative changes at both hips with subchondral cysts. No acute osseous abnormality identified.  Widespread calcified atherosclerosis including involvement of the abdominal aorta and its branches.  Widespread body wall subcutaneous fat stranding.  Foley catheter in place, the bladder is decompressed. Mild presacral stranding. Trace pelvic free fluid  (series 4, image 110). The rectum is decompressed and unremarkable.  Redundant sigmoid colon with gas and stool. More decompressed left colon with mild diverticulosis and retained stool. Redundant transverse colon with retained stool. Diverticulosis in the right colon with retained stool. Normal appendix. The cecum is mostly located in the pelvis. Oral contrast has just reached the terminal ileum. No dilated or abnormal small bowel loops identified. Percutaneous gastrostomy tube. Contrast in the stomach and duodenum with no adverse features.  Negative non contrast liver, gallbladder, spleen, pancreas, and adrenal glands. No hydronephrosis. Diminutive appearance of both kidneys. 12 mm lobulated calcification at the left renal hilum could be in the collecting system or other dystrophic nephrocalcinosis. No hydroureter. No abdominal free fluid identified. No free air. No lymphadenopathy. Ectatic common iliac arteries.  IMPRESSION: 1. Moderate size layering right and loculated left pleural effusions with bilateral pneumonia. The appearance of the chest is similar to that in July 2015. 2. There may be superimposed pulmonary edema, with evidence of some body wall  edema diffusely. And volume overload might also explain trace pelvic free fluid. 3. No other acute or inflammatory process identified. 4. Extensive atherosclerosis. Cardiomegaly. Severe diffuse idiopathic skeletal hyperostosis.   Electronically Signed   By: Genevie Ann M.D.   On: 11/26/2014 14:37   Ct Chest Wo Contrast  11/26/2014   CLINICAL DATA:  81 year old male with sepsis, fever of unknown origin, chronic kidney disease. Initial encounter.  EXAM: CT CHEST, ABDOMEN AND PELVIS WITHOUT CONTRAST  TECHNIQUE: Multidetector CT imaging of the chest, abdomen and pelvis was performed following the standard protocol without IV contrast.  COMPARISON:  Chest CT 01/24/2014. Renal ultrasound 10/12/2014. Abdomen MRI 10/17/2006. Chest CT 06/10/2014.  FINDINGS: CT CHEST  FINDINGS  Moderate bilateral pleural effusions, layering on the right and at least partially loculated on the left. Superimposed widespread pulmonary septal thickening, patchy peribronchovascular ground-glass opacity in the upper lobes, and lower lobe peribronchial thickening and some lower lobe consolidation worse on the right. Major airways are patent. The appearance of the lungs and pleura is very similar to that in July 2015, but was resolved on the intervening 06/10/2014 chest CT.  Incidental intravenous gas at the right subclavian and IJ. Cardiomegaly with aortic and coronary artery calcified plaque. No pericardial effusion.  Degenerative changes at both sternoclavicular joints appear stable. Mediastinal lymph nodes are mildly increased and appear reactive in nature, including an 11 mm prevascular node on series 4, image 20.  Diffuse degenerative changes and widespread ankylosis in the spine. Bulky ste lower cervical and thoracic flowing osteophytes. No acute osseous abnormality identified.  CT ABDOMEN AND PELVIS FINDINGS  Lower thoracic and lumbar flowing osteophytes with widespread ankylosis. Superimposed advanced lumbar disc and endplate degeneration. Partial ankylosis of both SI joints. Moderate to severe degenerative changes at both hips with subchondral cysts. No acute osseous abnormality identified.  Widespread calcified atherosclerosis including involvement of the abdominal aorta and its branches.  Widespread body wall subcutaneous fat stranding.  Foley catheter in place, the bladder is decompressed. Mild presacral stranding. Trace pelvic free fluid (series 4, image 110). The rectum is decompressed and unremarkable.  Redundant sigmoid colon with gas and stool. More decompressed left colon with mild diverticulosis and retained stool. Redundant transverse colon with retained stool. Diverticulosis in the right colon with retained stool. Normal appendix. The cecum is mostly located in the pelvis. Oral  contrast has just reached the terminal ileum. No dilated or abnormal small bowel loops identified. Percutaneous gastrostomy tube. Contrast in the stomach and duodenum with no adverse features.  Negative non contrast liver, gallbladder, spleen, pancreas, and adrenal glands. No hydronephrosis. Diminutive appearance of both kidneys. 12 mm lobulated calcification at the left renal hilum could be in the collecting system or other dystrophic nephrocalcinosis. No hydroureter. No abdominal free fluid identified. No free air. No lymphadenopathy. Ectatic common iliac arteries.  IMPRESSION: 1. Moderate size layering right and loculated left pleural effusions with bilateral pneumonia. The appearance of the chest is similar to that in July 2015. 2. There may be superimposed pulmonary edema, with evidence of some body wall edema diffusely. And volume overload might also explain trace pelvic free fluid. 3. No other acute or inflammatory process identified. 4. Extensive atherosclerosis. Cardiomegaly. Severe diffuse idiopathic skeletal hyperostosis.   Electronically Signed   By: Genevie Ann M.D.   On: 11/26/2014 14:37   Dg Chest Port 1 View  11/26/2014   CLINICAL DATA:  81 year old male with history of lung cancer, congestive heart and hypertension presenting with fever and chills. Initial  encounter.  EXAM: PORTABLE CHEST - 1 VIEW  COMPARISON:  10/22/2014.  FINDINGS: Progressive consolidation lung bases greater on the left. This may represent bibasilar infectious infiltrates superimposed upon post therapy changes of the left lung base.  Pulmonary vascular congestion/ mild pulmonary edema.  No gross pneumothorax.  Cardiomegaly.  Calcified tortuous aorta.  Limited for evaluating for presence of underlying mass. Recommend followup until clearance.  Bilateral shoulder joint degenerative changes.  IMPRESSION: Progressive consolidation lung bases greater on the left. This may represent bibasilar infectious infiltrates superimposed upon  post therapy changes of the left lung base.  Pulmonary vascular congestion/ mild pulmonary edema.   Electronically Signed   By: Genia Del M.D.   On: 11/26/2014 09:35    EKG:   S tachy, LAD RBBB  IMPRESSION AND PLAN:   81 year old Jaquarius Seder with past medical history of CAD stage III-4, small cell lung cancer with resection, history of dysphagia status post G-tube placement in April 2016 will recently was started on Bactrim due to skin infection around the G-tube site comes in today with increasing shortness of breath fever of 103.6 at home. Patient is being admitted with    #1 acute hypoxic respiratory failure secondary to combination of bilateral pleural effusion with right lower lobe pneumonia. This was confirmed with CT chest although readings shows patient's x-ray looks similar to that of July 2015. Patient clinically appears critically ill had fever 103.6 tachycardic initially and white count of 17,000.  We'll admit patient to telemetry floor. Continue IV  IV Vanco and Zosyn. Follow-up blood cultures sputum cultures. Pulmonic consultation if symptoms are not improved.  #2 sepsis secondary to pneumonia. Patient presented with fever of 103.6 elevated white count of 17,000 and abnormal chest x-ray. Treatment as above.  #3 elevated troponin suspected due to demand ischemia. Patient does have history of coronary artery disease we'll cycle cardiac enzyme 3.  #4 leukocytosis secondary to pneumonia. Continue to monitor.  #5 bilateral upper and lower extremity rash. DIC panel was sent from the emergency room. Continue to monitor rash. Consider nerve dermatology consultation if needed. Not sure if this rash could be due to Bactrim.  #6 chronic dysphagia with G-tube feeding will resume Jevity.  #7. CK D stage III  creatinine appears stable  #8 DVT prophylaxis subcutaneous heparin 3 times a day  #9 discharge planning with care Management and social worker  Above was discussed with  patient's wife was present emergency room critical time spent 60 minutes     All the records are reviewed and case discussed with ED provider. Management plans discussed with the patient, family and they are in agreement.  CODE STATUS:FULL TOTAL criticalTIME TAKING CARE OF THIS PATIENT:60 minutes.    Mccartney Chuba M.D on 11/26/2014 at 3:10 PM  Between 7am to 6pm - Pager - [phone]  After 6pm go to [email or web address] - password [email or web address]  [facility]  Office  [phone]  CC: Primary care physician; Maryland Pink, MD

## 2014-11-27 LAB — CREATININE, SERUM
CREATININE: 1.87 mg/dL — AB (ref 0.61–1.24)
GFR calc Af Amer: 35 mL/min — ABNORMAL LOW (ref >60)
GFR calc non Af Amer: 30 mL/min — ABNORMAL LOW (ref >60)

## 2014-11-27 LAB — CBC
HEMATOCRIT: 26.3 % — AB (ref 40.0–52.0)
Hemoglobin: 8.5 g/dL — ABNORMAL LOW (ref 13.0–18.0)
MCH: 31 pg (ref 26.0–34.0)
MCHC: 32.2 g/dL (ref 32.0–36.0)
MCV: 96.4 fL (ref 80.0–100.0)
Platelets: 86 10*3/uL — ABNORMAL LOW (ref 150–440)
RBC: 2.73 MIL/uL — AB (ref 4.40–5.90)
RDW: 20.1 % — ABNORMAL HIGH (ref 11.5–14.5)
WBC: 10.5 10*3/uL (ref 3.8–10.6)

## 2014-11-27 LAB — GLUCOSE, CAPILLARY
GLUCOSE-CAPILLARY: 200 mg/dL — AB (ref 70–99)
Glucose-Capillary: 122 mg/dL — ABNORMAL HIGH (ref 70–99)
Glucose-Capillary: 99 mg/dL (ref 70–99)
Glucose-Capillary: 117 mg/dL — ABNORMAL HIGH (ref 70–99)
Glucose-Capillary: 130 mg/dL — ABNORMAL HIGH (ref 70–99)
Glucose-Capillary: 119 mg/dL — ABNORMAL HIGH (ref 70–99)

## 2014-11-27 LAB — FIBRIN DEGRADATION PROD.(ARMC ONLY): Fibrin Degradation Prod.: >40 ug/mL — AB (ref <10)

## 2014-11-27 MED ORDER — ZOLPIDEM TARTRATE 5 MG PO TABS: 5.0000 mg | ORAL_TABLET | Freq: Every evening | ORAL | Status: DC | PRN

## 2014-11-27 NOTE — Progress Notes (Signed)
Nutrition Follow-up  DOCUMENTATION CODES:   INTERVENTION: EN: Continue Tube feeding of Jevity 1.2 bolus feeding 7 cans per day at 0800, 1200, 1600, 2200 to provide 1990 kcals, 100 gm of protein and 1368m free water.   NUTRITION DIAGNOSIS:  Inadequate oral intake related to dysphagia, inability to eat as evidenced by NPO status, being addressed with enteral nutrition.   GOAL: Energy Intake: Patient will meet greater than or equal to 90% of their needs, Provide needs based on ASPEN/SCCM guidelines.  MONITOR:  TF tolerance, Labs  REASON FOR ASSESSMENT:  Consult Enteral/tube feeding initiation and management  ASSESSMENT: Pt admitted with AMS, pneumonia, bilateral pleural effusion and sepsis  PMhx: chronic dysphagia recent PEG tube placement, COPD, CHF with EF of 30%, CKD, GERD, lung cancer  Spoke with wife at bedside. She reports that the patient was tolerating bolus TF at goal with no issues prior to admission. States he was also eating some puree foods like baby food in AM and some puree meats. Spoke with nurse, who said MD will order a SLP consult to assess PO ability and make recommendations.  Electrolyte and Renal Profile:    Recent Labs Lab 11/26/14 0910 11/26/14 1635 11/27/14 0419  BUN 77*  --   --   CREATININE 1.93* 1.80* 1.87*  NA 140  --   --   K 5.4*  --   --    Protein Profile:  Recent Labs Lab 11/26/14 0910  ALBUMIN 3.0*   Glucose Profile:  Recent Labs  11/27/14 0545 11/27/14 0726 11/27/14 1058  GLUCAP 122* 99 117*    Height:  Ht Readings from Last 1 Encounters:  11/26/14 '5\' 11"'$  (1.803 m)    Weight:  Wt Readings from Last 1 Encounters:  11/27/14 190 lb (86.183 kg)    Ideal Body Weight:     Wt Readings from Last 10 Encounters:  11/27/14 190 lb (86.183 kg)  10/02/14 173 lb (78.472 kg)    BMI:  Body mass index is 26.51 kg/(m^2).  Estimated Nutritional Needs:  Kcal:  1547 kcals (IF 1.0-1.2, AF 1.3) [address]40-1027kcals/d.    Protein:  (1.2-1.5 g/kg) 103-129 gm/d  Fluid:  (25-31mkg) 2150-258044m  Skin:  Reviewed, no issues  Diet Order:  Diet NPO time specified  EDUCATION NEEDS:  No education needs identified at this time   Intake/Output Summary (Last 24 hours) at 11/27/14 1404 Last data filed at 11/27/14 1326  Gross per 24 hour  Intake 1437.5 ml  Output    375 ml  Net 1062.5 ml    Last BM:  5/9 TraRoda [email or web address] Pager: [phone]: [email or web address]

## 2014-11-27 NOTE — Evaluation (Signed)
Physical Therapy Evaluation Patient Details Name: Mathew Morris MRN: [id number] DOB: Dec 10, 1923 Today's Date: 11/27/2014   History of Present Illness  81 yo male with onset of PNA B Lungs, cardiomegaly, pulm edema, severe atherosclerosis is referred to PT.  PMHx: Lung CA, CKD 3, sepsis with hypoxic respiratory failure.  Clinical Impression  Pt was evaluated and did do some work toward standing and taking a few steps, but is very much struggling to control standing.  He is best served by being referred to SNF, since he was previously ambulatory on only a cane.  His wife is very supportive and will be able to assist for limited help as she is using his walker now.  Will focus on increasing his standing stability towards going to SNF then home    Follow Up Recommendations SNF    Equipment Recommendations  None recommended by PT    Recommendations for Other Services       Precautions / Restrictions Precautions Precautions: Fall Restrictions Weight Bearing Restrictions: No Other Position/Activity Restrictions: PEG tube      Mobility  Bed Mobility               General bed mobility comments: up when PT entered  Transfers Overall transfer level: Needs assistance Equipment used: Rolling walker (2 wheeled);2 person hand held assist Transfers: Sit to/from [facility] Sit to Stand: Max assist;+2 physical assistance;+2 safety/equipment Stand pivot transfers: Mod assist;Min assist;+2 physical assistance;+2 safety/equipment       General transfer comment: buckling appearance of LE's with any standing  Ambulation/Gait Ambulation/Gait assistance: Mod assist;Max assist;+2 physical assistance;+2 safety/equipment Ambulation Distance (Feet): 5 Feet Assistive device: Rolling walker (2 wheeled);2 person hand held assist;1 person hand held assist Gait Pattern/deviations: Step-through pattern;Decreased dorsiflexion - left;Decreased dorsiflexion - right;Shuffle;Wide base  of support;Trunk flexed Gait velocity: reduced Gait velocity interpretation: Below normal speed for age/gender General Gait Details: buckling appearance of B knees, has a great deal of confidence in being able to walk despite appearance  Stairs            Wheelchair Mobility    Modified Rankin (Stroke Patients Only)       Balance Overall balance assessment: Needs assistance Sitting-balance support: Feet supported Sitting balance-Leahy Scale: Fair Sitting balance - Comments: needs back support Postural control: Right lateral lean;Posterior lean Standing balance support: Bilateral upper extremity supported Standing balance-Leahy Scale: Poor Standing balance comment: knees are not dependable to stand                             Pertinent Vitals/Pain Pain Assessment: No/denies pain    Home Living Family/patient expects to be discharged to:: Private residence Living Arrangements: Spouse/significant other Available Help at Discharge: Family Type of Home: House Home Access: Stairs to enter Entrance Stairs-Rails: Right;Left;Can reach both Entrance Stairs-Number of Steps: 4 Home Layout: One level Home Equipment: Environmental consultant - 2 wheels;Cane - single point;Shower seat;Bedside commode Additional Comments: wife is fragile and cannot help    Prior Function Level of Independence: Independent with assistive device(s)               Hand Dominance        Extremity/Trunk Assessment   Upper Extremity Assessment: Generalized weakness           Lower Extremity Assessment: Generalized weakness      Cervical / Trunk Assessment: Normal  Communication   Communication: No difficulties  Cognition Arousal/Alertness: Awake/alert Behavior During Therapy: [facility]  for tasks assessed/performed Overall Cognitive Status: Within Functional Limits for tasks assessed                      General Comments      Exercises        Assessment/Plan    PT  Assessment Patient needs continued PT services  PT Diagnosis Difficulty walking;Generalized weakness   PT Problem List Decreased strength;Decreased range of motion;Decreased activity tolerance;Decreased balance;Decreased mobility;Decreased coordination;Decreased knowledge of use of DME;Decreased safety awareness;Cardiopulmonary status limiting activity;Decreased skin integrity  PT Treatment Interventions DME instruction;Gait training;Stair training;Functional mobility training;Therapeutic activities;Therapeutic exercise;Neuromuscular re-education;Balance training;Patient/family education   PT Goals (Current goals can be found in the Care Plan section) Acute Rehab PT Goals Patient Stated Goal: To get better PT Goal Formulation: With patient/family Time For Goal Achievement: 12/11/14 Potential to Achieve Goals: Good    Frequency Min 2X/week   Barriers to discharge Inaccessible home environment;Decreased caregiver support      Co-evaluation               End of Session Equipment Utilized During Treatment: Gait belt;Oxygen Activity Tolerance: Patient tolerated treatment well;Patient limited by fatigue Patient left: in chair;with call bell/phone within reach;with nursing/sitter in room;with family/visitor present Nurse Communication: Mobility status         Time: 7741-2878 PT Time Calculation (min) (ACUTE ONLY): 40 min   Charges:   PT Evaluation $Initial PT Evaluation Tier I: 1 Procedure PT Treatments $Gait Training: 8-22 mins $Therapeutic Activity: 8-22 mins   PT G Codes:        Ramond Dial 12/21/2014, 11:32 AM   Mee Hives, PT MS Acute Rehab Dept. Number: [phone] [phone] and [place] [phone]

## 2014-11-27 NOTE — Care Management Note (Signed)
Case Management Note  Patient Details  Name: Mathew Morris MRN: [id number] Date of Birth: [birth year]  Subjective/Objective:           Presents from home with diagnosis of sepsis.  Temp of 103.  Source mostly likely pneumonia.  Patient receives bolus tube feedings at home.  Wife provides the care.  Notified [facility] of Admisison         Action/Plan:   Expected Discharge Date:                  Expected Discharge Plan:  [place] (unless family choses long term care.  Currently open to [facility])  In-House Referral:     Discharge planning Services     Post Acute Care Choice:    Choice offered to:     DME Arranged:    DME Agency:     HH Arranged:    [place] Agency:  [facility]  Status of Service:     Medicare Important Message Given:    Date Medicare IM Given:    Medicare IM give by:    Date Additional Medicare IM Given:    Additional Medicare Important Message give by:     If discussed at [place] of Stay Meetings, dates discussed:    Additional Comments:  Katrina Stack, RN 11/27/2014, 8:50 AM

## 2014-11-27 NOTE — Progress Notes (Signed)
Spoke with dr. Margaretmary Eddy to make aware d dimer is positive. Patient currently on sq heparin, po asa and plavix. md acknowledged, no new orders at this time

## 2014-11-27 NOTE — Progress Notes (Signed)
[place] [facility] [place] NAME: Mathew Morris    MR#:  [id number]  DATE OF BIRTH:  1924/02/15  SUBJECTIVE:  CHIEF COMPLAINT: Fever Today patient is more awake and alert. Out of bed to chair, wife at bedside, asking if he can eat by mouth. Wife reports the patient takes pured diet with nectar thick liquids at home in addition to the PEG feeds    REVIEW OF SYSTEMS:  CONSTITUTIONAL: Has fever, fatigue and weakness.  EYES: No blurred or double vision.  EARS, NOSE, AND THROAT: No tinnitus or ear pain.  RESPIRATORY: Has cough, shortness of breath, no wheezing or hemoptysis.  CARDIOVASCULAR: No chest pain, orthopnea, edema.  GASTROINTESTINAL: No nausea, vomiting, diarrhea or abdominal pain. PEG site is intact GENITOURINARY: No dysuria, hematuria.  ENDOCRINE: No polyuria, nocturia,  HEMATOLOGY: No anemia, easy bruising or bleeding SKIN: No rash or lesion. MUSCULOSKELETAL: No joint pain or arthritis.   NEUROLOGIC: No tingling, numbness, weakness.  PSYCHIATRY: No anxiety or depression.   DRUG ALLERGIES:  No Known Allergies  VITALS:  Blood pressure 90/50, pulse 64, temperature 97.4 F (36.3 C), temperature source Oral, resp. rate 16, height '5\' 11"'$  (1.803 m), weight 86.183 kg (190 lb), SpO2 95 %.  PHYSICAL EXAMINATION:  GENERAL:  81 y.o.-year-old patient lying in the bed with no acute distress.  EYES: Pupils equal, round, reactive to light and accommodation. No scleral icterus. Extraocular muscles intact.  HEENT: Head atraumatic, normocephalic. Oropharynx and nasopharynx clear.  NECK:  Supple, no jugular venous distention. No thyroid enlargement, no tenderness.  LUNGS: Normal breath sounds bilaterally, no wheezing, positive crackles and rales. No rhonchi or crepitation. No use of accessory muscles of respiration.  CARDIOVASCULAR: S1, S2 normal. No murmurs, rubs, or gallops.  ABDOMEN: Soft, nontender, nondistended. Erythema is noted around the  PEG tube but PEG tube is functioning fine as reported by the nurse. Bowel sounds present. No organomegaly or mass.  EXTREMITIES: No pedal edema, cyanosis, or clubbing.  NEUROLOGIC: Cranial nerves II through XII are intact.Sensation intact. Gait not checked.  PSYCHIATRIC: The patient is alert and oriented x 3.  SKIN:bilateral hands and feet beefy looking macular rash, non blanching. Bilateral groin area is erythematous  LABORATORY PANEL:   CBC  Recent Labs Lab 11/27/14 0419  WBC 10.5  HGB 8.5*  HCT 26.3*  PLT 86*   ------------------------------------------------------------------------------------------------------------------  Chemistries   Recent Labs Lab 11/26/14 0910  11/27/14 0419  NA 140  --   --   K 5.4*  --   --   CL 98*  --   --   CO2 31  --   --   GLUCOSE 90  --   --   BUN 77*  --   --   CREATININE 1.93*  < > 1.87*  CALCIUM 9.0  --   --   AST 48*  --   --   ALT 58  --   --   ALKPHOS 143*  --   --   BILITOT 1.1  --   --   < > = values in this interval not displayed. ------------------------------------------------------------------------------------------------------------------  Cardiac Enzymes  Recent Labs Lab 11/26/14 0910  TROPONINI 0.85*   ------------------------------------------------------------------------------------------------------------------  RADIOLOGY:  Ct Abdomen Pelvis Wo Contrast  11/26/2014   CLINICAL DATA:  81 year old male with sepsis, fever of unknown origin, chronic kidney disease. Initial encounter.  EXAM: CT CHEST, ABDOMEN AND PELVIS WITHOUT CONTRAST  TECHNIQUE: Multidetector CT imaging of the chest, abdomen and  pelvis was performed following the standard protocol without IV contrast.  COMPARISON:  Chest CT 01/24/2014. Renal ultrasound 10/12/2014. Abdomen MRI 10/17/2006. Chest CT 06/10/2014.  FINDINGS: CT CHEST FINDINGS  Moderate bilateral pleural effusions, layering on the right and at least partially loculated on the left.  Superimposed widespread pulmonary septal thickening, patchy peribronchovascular ground-glass opacity in the upper lobes, and lower lobe peribronchial thickening and some lower lobe consolidation worse on the right. Major airways are patent. The appearance of the lungs and pleura is very similar to that in July 2015, but was resolved on the intervening 06/10/2014 chest CT.  Incidental intravenous gas at the right subclavian and IJ. Cardiomegaly with aortic and coronary artery calcified plaque. No pericardial effusion.  Degenerative changes at both sternoclavicular joints appear stable. Mediastinal lymph nodes are mildly increased and appear reactive in nature, including an 11 mm prevascular node on series 4, image 20.  Diffuse degenerative changes and widespread ankylosis in the spine. Bulky ste lower cervical and thoracic flowing osteophytes. No acute osseous abnormality identified.  CT ABDOMEN AND PELVIS FINDINGS  Lower thoracic and lumbar flowing osteophytes with widespread ankylosis. Superimposed advanced lumbar disc and endplate degeneration. Partial ankylosis of both SI joints. Moderate to severe degenerative changes at both hips with subchondral cysts. No acute osseous abnormality identified.  Widespread calcified atherosclerosis including involvement of the abdominal aorta and its branches.  Widespread body wall subcutaneous fat stranding.  Foley catheter in place, the bladder is decompressed. Mild presacral stranding. Trace pelvic free fluid (series 4, image 110). The rectum is decompressed and unremarkable.  Redundant sigmoid colon with gas and stool. More decompressed left colon with mild diverticulosis and retained stool. Redundant transverse colon with retained stool. Diverticulosis in the right colon with retained stool. Normal appendix. The cecum is mostly located in the pelvis. Oral contrast has just reached the terminal ileum. No dilated or abnormal small bowel loops identified. Percutaneous  gastrostomy tube. Contrast in the stomach and duodenum with no adverse features.  Negative non contrast liver, gallbladder, spleen, pancreas, and adrenal glands. No hydronephrosis. Diminutive appearance of both kidneys. 12 mm lobulated calcification at the left renal hilum could be in the collecting system or other dystrophic nephrocalcinosis. No hydroureter. No abdominal free fluid identified. No free air. No lymphadenopathy. Ectatic common iliac arteries.  IMPRESSION: 1. Moderate size layering right and loculated left pleural effusions with bilateral pneumonia. The appearance of the chest is similar to that in July 2015. 2. There may be superimposed pulmonary edema, with evidence of some body wall edema diffusely. And volume overload might also explain trace pelvic free fluid. 3. No other acute or inflammatory process identified. 4. Extensive atherosclerosis. Cardiomegaly. Severe diffuse idiopathic skeletal hyperostosis.   Electronically Signed   By: Genevie Ann M.D.   On: 11/26/2014 14:37   Ct Chest Wo Contrast  11/26/2014   CLINICAL DATA:  81 year old male with sepsis, fever of unknown origin, chronic kidney disease. Initial encounter.  EXAM: CT CHEST, ABDOMEN AND PELVIS WITHOUT CONTRAST  TECHNIQUE: Multidetector CT imaging of the chest, abdomen and pelvis was performed following the standard protocol without IV contrast.  COMPARISON:  Chest CT 01/24/2014. Renal ultrasound 10/12/2014. Abdomen MRI 10/17/2006. Chest CT 06/10/2014.  FINDINGS: CT CHEST FINDINGS  Moderate bilateral pleural effusions, layering on the right and at least partially loculated on the left. Superimposed widespread pulmonary septal thickening, patchy peribronchovascular ground-glass opacity in the upper lobes, and lower lobe peribronchial thickening and some lower lobe consolidation worse on the right. Major airways  are patent. The appearance of the lungs and pleura is very similar to that in July 2015, but was resolved on the intervening  06/10/2014 chest CT.  Incidental intravenous gas at the right subclavian and IJ. Cardiomegaly with aortic and coronary artery calcified plaque. No pericardial effusion.  Degenerative changes at both sternoclavicular joints appear stable. Mediastinal lymph nodes are mildly increased and appear reactive in nature, including an 11 mm prevascular node on series 4, image 20.  Diffuse degenerative changes and widespread ankylosis in the spine. Bulky ste lower cervical and thoracic flowing osteophytes. No acute osseous abnormality identified.  CT ABDOMEN AND PELVIS FINDINGS  Lower thoracic and lumbar flowing osteophytes with widespread ankylosis. Superimposed advanced lumbar disc and endplate degeneration. Partial ankylosis of both SI joints. Moderate to severe degenerative changes at both hips with subchondral cysts. No acute osseous abnormality identified.  Widespread calcified atherosclerosis including involvement of the abdominal aorta and its branches.  Widespread body wall subcutaneous fat stranding.  Foley catheter in place, the bladder is decompressed. Mild presacral stranding. Trace pelvic free fluid (series 4, image 110). The rectum is decompressed and unremarkable.  Redundant sigmoid colon with gas and stool. More decompressed left colon with mild diverticulosis and retained stool. Redundant transverse colon with retained stool. Diverticulosis in the right colon with retained stool. Normal appendix. The cecum is mostly located in the pelvis. Oral contrast has just reached the terminal ileum. No dilated or abnormal small bowel loops identified. Percutaneous gastrostomy tube. Contrast in the stomach and duodenum with no adverse features.  Negative non contrast liver, gallbladder, spleen, pancreas, and adrenal glands. No hydronephrosis. Diminutive appearance of both kidneys. 12 mm lobulated calcification at the left renal hilum could be in the collecting system or other dystrophic nephrocalcinosis. No hydroureter.  No abdominal free fluid identified. No free air. No lymphadenopathy. Ectatic common iliac arteries.  IMPRESSION: 1. Moderate size layering right and loculated left pleural effusions with bilateral pneumonia. The appearance of the chest is similar to that in July 2015. 2. There may be superimposed pulmonary edema, with evidence of some body wall edema diffusely. And volume overload might also explain trace pelvic free fluid. 3. No other acute or inflammatory process identified. 4. Extensive atherosclerosis. Cardiomegaly. Severe diffuse idiopathic skeletal hyperostosis.   Electronically Signed   By: Genevie Ann M.D.   On: 11/26/2014 14:37   Dg Chest Port 1 View  11/26/2014   CLINICAL DATA:  81 year old male with history of lung cancer, congestive heart and hypertension presenting with fever and chills. Initial encounter.  EXAM: PORTABLE CHEST - 1 VIEW  COMPARISON:  10/22/2014.  FINDINGS: Progressive consolidation lung bases greater on the left. This may represent bibasilar infectious infiltrates superimposed upon post therapy changes of the left lung base.  Pulmonary vascular congestion/ mild pulmonary edema.  No gross pneumothorax.  Cardiomegaly.  Calcified tortuous aorta.  Limited for evaluating for presence of underlying mass. Recommend followup until clearance.  Bilateral shoulder joint degenerative changes.  IMPRESSION: Progressive consolidation lung bases greater on the left. This may represent bibasilar infectious infiltrates superimposed upon post therapy changes of the left lung base.  Pulmonary vascular congestion/ mild pulmonary edema.   Electronically Signed   By: Genia Del M.D.   On: 11/26/2014 09:35    EKG:   Orders placed or performed during the hospital encounter of 11/26/14  . EKG 12-Lead  . EKG 12-Lead    ASSESSMENT AND PLAN:    81 year old Mathew Morris with past medical history of CAD stage  III-4, small cell lung cancer with resection, history of dysphagia status post G-tube placement  in April 2016 will recently was started on Bactrim due to skin infection around the G-tube site comes in today with increasing shortness of breath fever of 103.6 at home. Patient is being admitted with   #1 acute hypoxic respiratory failure secondary to combination of bilateral pleural effusion with right lower lobe pneumonia. This was confirmed with CT chest although readings shows patient's x-ray looks similar to that of July 2015. Patient clinically appears better and afebrile today.We'll admit patient to telemetry floor. Continue IV IV Vanco and Zosyn.Follow-up blood cultures sputum cultures. Pulmonology consult is placed .  #2 sepsis secondary to pneumonia. Patient presented with fever of 103.6 elevated white count of 17,000 and abnormal chest x-ray. Treatment as above.  #3 elevated troponin suspected due to demand ischemia. Patient does have history of coronary artery disease. cardiac enzyme 3.  #4 leukocytosis secondary to pneumonia. Continue to monitor.  #5 bilateral upper and lower extremity rash. DIC panel reveals a significantly elevated D dimers which are greater than 4000. This can be from the underlying sepsis , d-dimer can be an acute phase reactant in this case . I cannot order CT angiogram of the chest to rule out pulmonary embolism as his renal function is abnormal. VQ scan is going to be indeterminate in view of his pneumonia and pleural effusions. However as the patient's d-dimer is significantly high at greater than 4000 will get an expert opinion from pulmonology. Discussed with Dr. Jenell Milliner. Will obtain echocardiogram of the heart #6 chronic dysphagia with G-tube feeding will resume Jev[place]. We will obtain speech therapy evaluation  regarding patient's by mouth intake  #7. CK D stage III creatinine appears stable  #8 DVT prophylaxis subcutaneous heparin 3 times a day, while closely monitoring of his platelet count as he has thrombocytopenia. Today his platelet count is at  14  #9 discharge planning with care Management and social worker     All the records are reviewed and case discussed with Care Management/Social Workerr. Management plans discussed with the patient, family and they are in agreement.  CODE STATUS: full  TOTAL TIME TAKING CARE OF THIS PATIENT: 35 minutes.   POSSIBLE D/C IN 2-3 DAYS, DEPENDING ON CLINICAL CONDITION.   Nicholes Mango M.D on 11/27/2014 at 2:56 PM  Between 7am to 6pm - Pager - [phone] After 6pm go to [email or web address] - password [email or web address] [facility]  [facility]  Office  [phone]  CC: Primary care physician; [place] Pink, MD

## 2014-11-27 NOTE — Progress Notes (Signed)
Pt alertx4. Voice is soft and hoarse. VSS. No complaints of pain. 2 assist to transfer. 4L 02. PEG tube maintained. Pt resting in bed. Will continue to monitor.

## 2014-11-28 LAB — GLUCOSE, CAPILLARY
GLUCOSE-CAPILLARY: 111 mg/dL — AB (ref 65–99)
GLUCOSE-CAPILLARY: 110 mg/dL — AB (ref 65–99)
Glucose-Capillary: 118 mg/dL — ABNORMAL HIGH (ref 65–99)
Glucose-Capillary: 153 mg/dL — ABNORMAL HIGH (ref 65–99)
Glucose-Capillary: 165 mg/dL — ABNORMAL HIGH (ref 65–99)

## 2014-11-28 LAB — BASIC METABOLIC PANEL
ANION GAP: 9 (ref 5–15)
BUN: 77 mg/dL — AB (ref 6–20)
CALCIUM: 8.6 mg/dL — AB (ref 8.9–10.3)
CO2: 31 mmol/L (ref 22–32)
CREATININE: 1.95 mg/dL — AB (ref 0.61–1.24)
Chloride: 101 mmol/L (ref 101–111)
GFR calc Af Amer: 33 mL/min — ABNORMAL LOW (ref >60)
GFR, EST NON AFRICAN AMERICAN: 29 mL/min — AB (ref >60)
Glucose, Bld: 106 mg/dL — ABNORMAL HIGH (ref 65–99)
Potassium: 4.7 mmol/L (ref 3.5–5.1)
Sodium: 141 mmol/L (ref 135–145)

## 2014-11-28 LAB — URINE CULTURE: CULTURE: NO GROWTH

## 2014-11-28 LAB — CBC
HCT: 26.7 % — ABNORMAL LOW (ref 40.0–52.0)
Hemoglobin: 8.7 g/dL — ABNORMAL LOW (ref 13.0–18.0)
MCH: 31.2 pg (ref 26.0–34.0)
MCHC: 32.4 g/dL (ref 32.0–36.0)
MCV: 96.3 fL (ref 80.0–100.0)
Platelets: 91 10*3/uL — ABNORMAL LOW (ref 150–440)
RBC: 2.77 MIL/uL — AB (ref 4.40–5.90)
RDW: 20.6 % — AB (ref 11.5–14.5)
WBC: 9.3 10*3/uL (ref 3.8–10.6)

## 2014-11-28 NOTE — Progress Notes (Signed)
[facility] [place] NAME: Mathew Morris    MR#:  [id number]  DATE OF BIRTH:  05-23-1924  SUBJECTIVE:  CHIEF COMPLAINT: Fever Today patient is lethargic as he didn't sleep well last night  From frequent VS check, arousable but falling aslep.  Out of bed to chair, wife at bedside.  REVIEW OF SYSTEMS:   Unobtainable as pt is lethargic DRUG ALLERGIES:  No Known Allergies  VITALS:  Blood pressure 119/60, pulse 73, temperature 97.9 F (36.6 C), temperature source Oral, resp. rate 19, height '5\' 11"'$  (1.803 m), weight 88.213 kg (194 lb 7.6 oz), SpO2 100 %.  PHYSICAL EXAMINATION:  GENERAL:  81 y.o.-year-old patient lying in the bed with no acute distress.  EYES: Pupils equal, round, reactive to light and accommodation. No scleral icterus. HEENT: Head atraumatic, normocephalic. Oropharynx and nasopharynx clear.  NECK:  Supple, no jugular venous distention. No thyroid enlargement, no tenderness.  LUNGS: Normal breath sounds bilaterally, no wheezing, positive crackles and rales. No rhonchi or crepitation. No use of accessory muscles of respiration.  CARDIOVASCULAR: S1, S2 normal. No murmurs, rubs, or gallops.  ABDOMEN: Soft, nontender, nondistended. Erythema is noted around the PEG tube but PEG tube is functioning fine as reported by the nurse. Bowel sounds present. No organomegaly or mass.  EXTREMITIES: No pedal edema, cyanosis, or clubbing.  NEUROLOGIC: Cranial nerves II through XII are intact.Sensation intact. Gait not checked.  PSYCHIATRIC: The patient is alert and oriented x 3.  SKIN:bilateral hands and feet beefy looking macular rash, non blanching. Bilateral groin area is erythematous  LABORATORY PANEL:   CBC  Recent Labs Lab 11/28/14 0444  WBC 9.3  HGB 8.7*  HCT 26.7*  PLT 91*   ------------------------------------------------------------------------------------------------------------------  Chemistries   Recent Labs Lab  11/26/14 0910  11/28/14 0444  NA 140  --  141  K 5.4*  --  4.7  CL 98*  --  101  CO2 31  --  31  GLUCOSE 90  --  106*  BUN 77*  --  77*  CREATININE 1.93*  < > 1.95*  CALCIUM 9.0  --  8.6*  AST 48*  --   --   ALT 58  --   --   ALKPHOS 143*  --   --   BILITOT 1.1  --   --   < > = values in this interval not displayed. ------------------------------------------------------------------------------------------------------------------  Cardiac Enzymes  Recent Labs Lab 11/26/14 0910  TROPONINI 0.85*   ------------------------------------------------------------------------------------------------------------------  RADIOLOGY:  No results found.  EKG:   Orders placed or performed during the hospital encounter of 11/26/14  . EKG 12-Lead  . EKG 12-Lead  . EKG 12-Lead  . EKG 12-Lead    ASSESSMENT AND PLAN:    81 year old Osiris Odriscoll with past medical history of CAD stage III-4, small cell lung cancer with resection, history of dysphagia status post G-tube placement in April 2016 will recently was started on Bactrim due to skin infection around the G-tube site comes in today with increasing shortness of breath fever of 103.6 at home. Patient is being admitted with   #1 acute hypoxic respiratory failure secondary to combination of bilateral pleural effusion with right lower lobe pneumonia. This was confirmed with CT chest although readings shows patient's x-ray looks similar to that of July 2015. Continue IV IV Vanco and Zosyn.Follow-up blood cultures sputum cultures. Appreciate Pulmonology consult .  #2 sepsis secondary to pneumonia. Patient presented with fever of 103.6 elevated  white count of 17,000 and abnormal chest x-ray. Treatment as above.  #3 elevated troponin suspected due to demand ischemia. Patient does have history of coronary artery disease. cardiac enzyme 3.  #4 leukocytosis secondary to pneumonia. Continue to monitor.  #5 bilateral upper and lower extremity  rash. DIC panel reveals a significantly elevated D dimers which are greater than 4000. This can be from the underlying sepsis , d-dimer can be an acute phase reactant in this case . I cannot order CT angiogram of the chest to rule out pulmonary embolism as his renal function is abnormal. VQ scan is going to be indeterminate in view of his pneumonia and pleural effusions. However as the patient's d-dimer is significantly high at greater than 4000 will get an expert opinion from pulmonology. Discussed with Dr. Mortimer Fries, recommended no w/u for PE , suggesting palliative care  #6 chronic dysphagia with G-tube feeding will resume Jevity. S/p speech therapy evaluation- ok with pleasure eating - nectar thick liquids and puree diet prn    #7. CK D stage III creatinine appears stable  #8 DVT prophylaxis subcutaneous heparin 3 times a day, while closely monitoring of his platelet count as he has thrombocytopenia. Today his platelet count is at 91  #9 discharge planning with care Management and social worker     All the records are reviewed and case discussed with Care Management/Social Workerr. Management plans discussed with the patient, family and they are in agreement.  CODE STATUS: full  TOTAL TIME TAKING CARE OF THIS PATIENT: 35 minutes.   POSSIBLE D/C IN 2-3 DAYS, DEPENDING ON CLINICAL CONDITION.   Nicholes Mango M.D on 11/28/2014 at 8:22 PM  Between 7am to 6pm - Pager - [phone] After 6pm go to [email or web address] - password [email or web address] [facility]  [facility]  Office  [phone]  CC: Primary care physician; [place] Pink, MD

## 2014-11-28 NOTE — Progress Notes (Signed)
Pt is alertx4. VSS. No complaints of pain. 1-2 assist for transfers. NS on tele. Pt has not slept majority of shift. PEG tube maintained. Up to chair currently. Will continue to monitor.

## 2014-11-28 NOTE — Evaluation (Signed)
Clinical/Bedside Swallow Evaluation Patient Details  Name: Mathew Morris MRN: [id number] Date of Birth: 24-Nov-1923  Today's Date: 11/28/2014 Time: SLP Start Time (ACUTE ONLY): 0900 SLP Stop Time (ACUTE ONLY): 1000 SLP Time Calculation (min) (ACUTE ONLY): 60 min  Past Medical History:  Past Medical History  Diagnosis Date  . CHF (congestive heart failure)   . Dysphagia   . Anemia   . Hypothyroid   . Hypertension   . Enlarged prostate   . Chronic kidney disease   . Arthritis   . Hyperlipidemia   . GERD (gastroesophageal reflux disease)   . Gout   . Lung cancer   . Obstructive sleep apnea    Past Surgical History:  Past Surgical History  Procedure Laterality Date  . Cardiac catheterization    . Lobectomy Left   . Thyroidectomy    . Bilateral carpal tunnel release Bilateral    HPI:  pt has known pharyngeal phase dysphagia during previous admissions(impact from an osteophyte impeding the swallowing). He has received a PEG placement w/hile taking pleasure po's of NDD1 w/ Nectar liquiids understanding the high risk for aspiration ot occur.    Assessment / Plan / Recommendation Clinical Impression  Pt presents w/ high risk for aspiration (see previous MBSS results explaining the impact of they osteophyte impeding the swallow during the pharyngeal phase). Pt received a PEG placement last admission and planned to take only pleasure po's for his quality of life. This was again explained to pt and wife that there was a high risk for aspiration of any po intake; both agreed. Pt is rec'd to continue w/ PEG TFs as his primary nutrition/hydration and discuss w/ his MD re: the choice to take pleausre po's for his quality of life sec . to the high risk for aspiration and decline of respiratory status/potential aspiration pneumonia.     Aspiration Risk  Severe    Diet Recommendation NPO;Other (Comment) (continue w/ TFs via PEG; discuss w/ MD re: choice for quality of life to include pleasure  po's)   Medication Administration: Other (Comment) (PEG route)    Other  Recommendations Oral Care Recommendations: Oral care BID   Follow Up Recommendations       Frequency and Duration    1 week   Pertinent Vitals/Pain denied    SLP Swallow Goals     Swallow Study Prior Functional Status       General Date of Onset: 11/28/14 Other Pertinent Information: pt has known pharyngeal phase dysphagia during previous admissions(impact from an osteophyte impeding the swallowing). He has received a PEG placement w/hile taking pleasure po's of NDD1 w/ Nectar liquiids understanding the high risk for aspiration ot occur.  Type of Study: Bedside swallow evaluation Previous Swallow Assessment: yes; see above Diet Prior to this Study: PEG tube;Other (Comment) (pleasure po's of NDD1 w/ Nectar liquids) Temperature Spikes Noted: Yes Respiratory Status: Supplemental O2 delivered via (comment) History of Recent Intubation: No Behavior/Cognition: Alert;Cooperative;Pleasant mood Oral Cavity - Dentition: Adequate natural dentition/normal for age Self-Feeding Abilities: Able to feed self;Needs assist Patient Positioning: Upright in chair/Tumbleform Baseline Vocal Quality: Low vocal intensity Volitional Cough: Strong Volitional Swallow: Able to elicit    Oral/Motor/Sensory Function Overall Oral Motor/Sensory Function: Appears within functional limits for tasks assessed   Ice Chips Ice chips: Not tested   Thin Liquid Thin Liquid: Not tested    Nectar Thick Nectar Thick Liquid: Impaired Presentation: Spoon Pharyngeal Phase Impairments: Suspected delayed Swallow;Wet Vocal Quality;Cough - Delayed;Throat Clearing - Delayed   Honey  Thick Honey Thick Liquid: Not tested   Puree Puree: Impaired Presentation: Self Fed Pharyngeal Phase Impairments: Wet Vocal Quality;Throat Clearing - Delayed   Solid   GO    Solid: Not tested       Watson,Katherine 11/28/2014,3:57 PM

## 2014-11-28 NOTE — Consult Note (Signed)
PULMONARY & CRITICAL CARE CONSULT   PATIENT NAME: Mathew Morris    MR#:  [id number]  DATE OF BIRTH:  Jul 25, 1923  DATE OF ADMISSION:  11/26/2014    CHIEF COMPLAINT:  Altered mental status and fever 103.6 at home  HISTORY OF PRESENT ILLNESS:   81 y.o. male with a known history of history of COPD stage II chronic dysphagia status post gastrostomy tube moderate malnutrition ,, small cell lung cancer with resection,  history of congestive heart failure with severe systolic cardiac myopathy EF of 30% comes to the emergency room after he was found to have altered mental status at home per wife. Patient was noted by EMS to have fever of 103.6. Patient is a chronically on 2 L nasal canal oxygen tip at present he is on 4 L nasal cannula oxygen with sats in the 90s. Also on CPAP at home,   In the emergency room, patient was found to be hypoxic with a PaO2 of 64 chest x-ray shows bilateral pneumonia with bilateral pleural effusion. This was confirmed with CT of the chest as well. Patient received vancomycin and IV Zosyn. Patient wife states that patient was given abx for PEG tube infection.   Patient was also found to have bilateral upper and lower extremity swelling with significant rash. He was recently started on  CODE STATUS confirmed with patient's wife was the healthcare provider  patient is a full code.   PAST MEDICAL HISTORY:   Past Medical History  Diagnosis Date  . CHF (congestive heart failure)   . Dysphagia   . Anemia   . Hypothyroid   . Hypertension   . Enlarged prostate   . Chronic kidney disease   . Arthritis   . Hyperlipidemia   . GERD (gastroesophageal reflux disease)   . Gout   . Lung cancer   . Obstructive sleep apnea     PAST SURGICAL HISTOIRY:   Past Surgical History  Procedure Laterality Date  . Cardiac catheterization    . Lobectomy Left   . Thyroidectomy    . Bilateral carpal tunnel release Bilateral     SOCIAL HISTORY:   History  Substance Use Topics   . Smoking status: Former Smoker    Quit date: 11/12/1983  . Smokeless tobacco: Never Used  . Alcohol Use: No    FAMILY HISTORY:   Family History  Problem Relation Age of Onset  . Stomach cancer Father   . Breast cancer Sister   . COPD Sister     DRUG ALLERGIES:  No Known Allergies  REVIEW OF SYSTEMS:  Review of Systems  Unable to perform ROS: critical illness     MEDICATIONS AT HOME:   Prior to Admission medications   Medication Sig Start Date End Date Taking? Authorizing Provider  allopurinol (ZYLOPRIM) 100 MG tablet Take 100 mg by mouth 2 (two) times daily.   Yes Historical Provider, MD  aspirin 325 MG tablet Take 325 mg by mouth daily.   Yes Historical Provider, MD  atorvastatin (LIPITOR) 10 MG tablet Take 10 mg by mouth daily.   Yes Historical Provider, MD  carboxymethylcellulose (REFRESH PLUS) 0.5 % SOLN Place 1 drop into both eyes 2 (two) times daily as needed.   Yes Historical Provider, MD  carvedilol (COREG) 6.25 MG tablet Take 6.25 mg by mouth 2 (two) times daily with a meal.   Yes Historical Provider, MD  clopidogrel (PLAVIX) 75 MG tablet Take 75 mg by mouth daily.   Yes Historical Provider, MD  ferrous sulfate 325 (65 FE) MG tablet Take 325 mg by mouth 2 (two) times daily with a meal.   Yes Historical Provider, MD  finasteride (PROSCAR) 5 MG tablet Take 5 mg by mouth daily.   Yes Historical Provider, MD  nitroGLYCERIN (NITRODUR - DOSED IN MG/24 HR) 0.4 mg/hr patch Place 0.4 mg onto the skin daily.   Yes Historical Provider, MD  nitroGLYCERIN (NITROSTAT) 0.4 MG SL tablet Place 0.4 mg under the tongue every 5 (five) minutes as needed for chest pain.   Yes Historical Provider, MD  potassium chloride (KLOR-CON) 20 MEQ packet Take 10 mEq by mouth daily.   Yes Historical Provider, MD  spironolactone (ALDACTONE) 25 MG tablet Take 25 mg by mouth daily.   Yes Historical Provider, MD  sulfamethoxazole-trimethoprim (BACTRIM DS,SEPTRA DS) 800-160 MG per tablet Take 1 tablet by  mouth daily. Give dose after Hemodialysis days   Yes Historical Provider, MD  tamsulosin (FLOMAX) 0.4 MG CAPS capsule Take 0.4 mg by mouth daily.   Yes Historical Provider, MD  torsemide (DEMADEX) 20 MG tablet Take 20 mg by mouth daily as needed (for edema or weight gain).   Yes Historical Provider, MD  dexlansoprazole (DEXILANT) 60 MG capsule Take 60 mg by mouth daily.    Historical Provider, MD  dronabinol (MARINOL) 5 MG capsule Take 5 mg by mouth 2 (two) times daily before a meal.    Historical Provider, MD  HYDROcodone-acetaminophen (NORCO/VICODIN) 5-325 MG per tablet Take 1 tablet by mouth every 6 (six) hours as needed for moderate pain.    Historical Provider, MD  isosorbide mononitrate (IMDUR) 120 MG 24 hr tablet Take 120 mg by mouth daily as needed (for chest pain).    Historical Provider, MD  ramipril (ALTACE) 2.5 MG capsule Take 2.5 mg by mouth daily.    Historical Provider, MD      VITAL SIGNS:  Blood pressure 119/68, pulse 82, temperature 98.1 F (36.7 C), temperature source Oral, resp. rate 20, height '5\' 11"'$  (1.803 m), weight 194 lb 7.6 oz (88.213 kg), SpO2 99 %.  PHYSICAL EXAMINATION:  Physical Exam  Constitutional: He appears distressed.  HENT:  Head: Normocephalic and atraumatic.  Eyes: Pupils are equal, round, and reactive to light. No scleral icterus.  Neck: Normal range of motion. Neck supple.  Cardiovascular: Normal rate and regular rhythm.   No murmur heard. Pulmonary/Chest: No respiratory distress. He has wheezes. He has rales.  resp distress  Abdominal: Soft. He exhibits no distension. There is no tenderness.  Musculoskeletal: He exhibits no edema.  Neurological: He displays normal reflexes. Coordination normal.  gcs<8T  Skin: Skin is warm. No rash noted. He is not diaphoretic.     LABORATORY PANEL:   CBC  Recent Labs Lab 11/28/14 0444  WBC 9.3  HGB 8.7*  HCT 26.7*  PLT 91*    ------------------------------------------------------------------------------------------------------------------  Chemistries   Recent Labs Lab 11/26/14 0910  11/28/14 0444  NA 140  --  141  K 5.4*  --  4.7  CL 98*  --  101  CO2 31  --  31  GLUCOSE 90  --  106*  BUN 77*  --  77*  CREATININE 1.93*  < > 1.95*  CALCIUM 9.0  --  8.6*  AST 48*  --   --   ALT 58  --   --   ALKPHOS 143*  --   --   BILITOT 1.1  --   --   < > = values in this  interval not displayed. ------------------------------------------------------------------------------------------------------------------  Cardiac Enzymes  Recent Labs Lab 11/26/14 0910  TROPONINI 0.85*   ------------------------------------------------------------------------------------------------------------------  RADIOLOGY:  No results found.  EKG:   S tachy, LAD RBBB  IMPRESSION AND PLAN:   81 year old AAM with past medical history of CAD stage III-4, small cell lung cancer with resection, history of dysphagia status post G-tube placement in April 2016 will recently was started on Bactrim due to skin infection around the G-tube site admitted for acute pneumonia with acute COPD exacerbation with acute CHF exacerbation with sepsis.    #1 acute hypoxic respiratory failure secondary to combination of bilateral pleural effusion with right lower lobe pneumonia. -continue oxygen as needed -CPAP as tolerated   #2 sepsis secondary to pneumonia.  -IVF's -continue abx   #3 bilateral upper and lower extremity rash. -Consider nerve dermatology consultation if needed.   #4 chronic dysphagia with G-tube feeding  -patient at very HIGH risk for aspiration  -recommend NO ORAL INTAKE   Overall, prognosis is very poor, recommend Palliative care consult, recommend DNR status   I have personally obtained a history, examined the patient, evaluated Pertinent laboratory and RadioGraphic/imaging results, and  formulated the assessment  and plan   The Patient requires high complexity decision making for assessment and support, frequent evaluation and titration of therapies. Time Spent with patient 45 mins  Corrin Parker, M.D. [facility] Director Intensive Care Unit

## 2014-11-28 NOTE — Clinical Social Work Note (Signed)
Clinical Social Work Assessment  Patient Details  Name: Mathew Morris MRN: [id number] Date of Birth: 1924/04/05  Date of referral:  11/28/14               Reason for consult:  Facility Placement                Permission sought to share information with:  Family Supports, Chartered certified accountant granted to share information::  Yes, Verbal Permission Granted  Name::        Agency::     Relationship::     Contact Information:     Housing/Transportation Living arrangements for the past 2 months:  Single Family Home Source of Information:  Patient, Spouse Patient Interpreter Needed:  None Criminal Activity/Legal Involvement Pertinent to Current Situation/Hospitalization:  No - Comment as needed Significant Relationships:  Spouse Lives with:  Spouse Do you feel safe going back to the place where you live?    Need for family participation in patient care:  Yes (Comment)  Care giving concerns:  Pt's wife was concerened about pt returning home in his current condition.  CSW explained that it was suggested that he go to a SNF before returning home.  Pt's wife was in agreement with this.     Social Worker assessment / plan:  CSW was able to speak to the pt a little bit before he went back to sleep.  Pt speaks very softly but he is oriented x3.  CSW spoke to pt's wife who also uses a rolaid walker.  SHe is in agreement with DC to SNF for pt.    Employment status:  Retired Forensic scientist:  Medicare PT Recommendations:  [place] / Referral to community resources:     Patient/Family's Response to care:  Wife thanked CSW for speaking to her about placement. She would prefer [facility] if possible.   Patient/Family's Understanding of and Emotional Response to Diagnosis, Current Treatment, and Prognosis:  Pt's wife was in agreement with placement at SNF  Emotional Assessment Appearance:  Appears stated age Attitude/Demeanor/Rapport:    (pt was very sleepy when CSW attempted to speak to him  and he fell asleep during the assessment.) Affect (typically observed):    Orientation:  Oriented to Self, Oriented to Place, Oriented to  Time Alcohol / Substance use:  Never Used Psych involvement (Current and /or in the community):  No (Comment)  Discharge Needs  Concerns to be addressed:    Readmission within the last 30 days:    Current discharge risk:    Barriers to Discharge:      Mathews Argyle, LCSW 11/28/2014, 4:18 PM

## 2014-11-29 LAB — GLUCOSE, CAPILLARY
GLUCOSE-CAPILLARY: 120 mg/dL — AB (ref 65–99)
Glucose-Capillary: 111 mg/dL — ABNORMAL HIGH (ref 65–99)
Glucose-Capillary: 135 mg/dL — ABNORMAL HIGH (ref 65–99)
Glucose-Capillary: 154 mg/dL — ABNORMAL HIGH (ref 65–99)
Glucose-Capillary: 159 mg/dL — ABNORMAL HIGH (ref 65–99)
Glucose-Capillary: 94 mg/dL (ref 65–99)

## 2014-11-29 LAB — CBC
HEMATOCRIT: 26.3 % — AB (ref 40.0–52.0)
HEMOGLOBIN: 8.4 g/dL — AB (ref 13.0–18.0)
MCH: 30.9 pg (ref 26.0–34.0)
MCHC: 32.1 g/dL (ref 32.0–36.0)
MCV: 96.5 fL (ref 80.0–100.0)
PLATELETS: 92 10*3/uL — AB (ref 150–440)
RBC: 2.73 MIL/uL — ABNORMAL LOW (ref 4.40–5.90)
RDW: 20.2 % — ABNORMAL HIGH (ref 11.5–14.5)
WBC: 8.7 10*3/uL (ref 3.8–10.6)

## 2014-11-29 LAB — CREATININE, SERUM
CREATININE: 2.02 mg/dL — AB (ref 0.61–1.24)
GFR calc non Af Amer: 28 mL/min — ABNORMAL LOW (ref >60)
GFR, EST AFRICAN AMERICAN: 32 mL/min — AB (ref >60)

## 2014-11-29 LAB — CLOSTRIDIUM DIFFICILE BY PCR: CDIFFPCR: NEGATIVE

## 2014-11-29 LAB — C DIFFICILE QUICK SCREEN W PCR REFLEX
C DIFFICILE (CDIFF) TOXIN: NEGATIVE
C Diff antigen: POSITIVE

## 2014-11-29 MED ORDER — SODIUM CHLORIDE 0.9 % IV SOLN
INTRAVENOUS | Status: DC
  Administered 2014-11-29: 18:00:00 via INTRAVENOUS

## 2014-11-29 NOTE — Progress Notes (Signed)
Nutrition Follow-up  DOCUMENTATION CODES:  INTERVENTION: EN: Continue Tube feeding of Jevity 1.2 bolus feeding 7 cans per day at 0800, 1200, 1600, 2200 to provide 1990 kcals, 100 gm of protein and 1382m free water. Meals and Snacks: Cater to patient preferences   NUTRITION DIAGNOSIS:  Inadequate oral intake related to dysphagia, inability to eat as evidenced by NPO status, being addressed with enteral nutrition.   GOAL: Energy Intake: Patient will meet greater than or equal to 90% of their needs, Provide needs based on ASPEN/SCCM guidelines- currently meeting goal  MONITOR:  TF tolerance, Labs  REASON FOR ASSESSMENT:  Enteral/tube feeding initiation and management  ASSESSMENT: Pt admitted with AMS, pneumonia, bilateral pleural effusion and sepsis  PMhx: chronic dysphagia recent PEG tube placement, COPD, CHF with EF of 30%, CKD, GERD, lung cancer  Current Diet: Dys 1, Nectar Thick Liquids Intake of Meals: Not recorded  Labs: Electrolyte and Renal Profile:    Recent Labs Lab 11/26/14 0910  11/27/14 0419 11/28/14 0444 11/29/14 0437  BUN 77*  --   --  77*  --   CREATININE 1.93*  < > 1.87* 1.95* 2.02*  NA 140  --   --  141  --   K 5.4*  --   --  4.7  --   < > = values in this interval not displayed. Protein Profile:  Recent Labs Lab 11/26/14 0910  ALBUMIN 3.0*   Filed Weights   11/27/14 0426 11/28/14 0619 11/29/14 0419  Weight: 190 lb (86.183 kg) 194 lb 7.6 oz (88.213 kg) 198 lb 3.2 oz (89.903 kg)   Body mass index is 27.66 kg/(m^2).  I/O last 3 completed shifts: In: 436[IV Piggyback:42] Out: 452 [Urine:450; Stool:2] Total I/O In: -  Out: 1 [Stool:1]    Intake/Output Summary (Last 24 hours) at 11/29/14 1414 Last data filed at 11/29/14 1355  Gross per 24 hour  Intake     42 ml  Output    251 ml  Net   -209 ml    TRoda Shutters RDN Pager: [phone] 1991Office: [facility]

## 2014-11-29 NOTE — Progress Notes (Signed)
[facility] [place] NAME: Mathew Morris    MR#:  [id number]  DATE OF BIRTH:  04/04/1924  SUBJECTIVE:  CHIEF COMPLAINT: Fever Today patient is more awake and alert. Reports he slept well last night. Wife is not at bedside. No new complaints. Out of bed to chair.Marland Kitchen  REVIEW OF SYSTEMS:   CONSTITUTIONAL: No fever, fatigue or weakness.  EYES: No blurred or double vision.  EARS, NOSE, AND THROAT: No tinnitus or ear pain.  RESPIRATORY: No cough, shortness of breath, wheezing or hemoptysis.  CARDIOVASCULAR: No chest pain, orthopnea, edema.  GASTROINTESTINAL: No nausea, vomiting, diarrhea or some generalized abdominal pain.  GENITOURINARY: No dysuria, hematuria.  ENDOCRINE: No polyuria, nocturia,  HEMATOLOGY: No anemia, easy bruising or bleeding SKIN: Redness in the groin area is resolved. Still has erythema in the lower extremities and bilateral feet MUSCULOSKELETAL: No joint pain or arthritis.  NEUROLOGIC: No tingling, numbness, weakness.  PSYCHIATRY: No anxiety or depression.  DRUG ALLERGIES:  No Known Allergies  VITALS:  Blood pressure 121/62, pulse 73, temperature 97.8 F (36.6 C), temperature source Oral, resp. rate 18, height '5\' 11"'$  (1.803 m), weight 89.903 kg (198 lb 3.2 oz), SpO2 100 %.  PHYSICAL EXAMINATION:  GENERAL:  81 y.o.-year-old patient lying in the bed with no acute distress.  EYES: Pupils equal, round, reactive to light and accommodation. No scleral icterus. HEENT: Head atraumatic, normocephalic. Oropharynx and nasopharynx clear.  NECK:  Supple, no jugular venous distention. No thyroid enlargement, no tenderness.  LUNGS: Normal breath sounds bilaterally, no wheezing, positive crackles and rales. No rhonchi or crepitation. No use of accessory muscles of respiration.  CARDIOVASCULAR: S1, S2 normal. No murmurs, rubs, or gallops.  ABDOMEN: Soft, nontender, nondistended. Erythema is noted around the PEG tube but  PEG tube is functioning fine as reported by the nurse. Bowel sounds present. No organomegaly or mass.  EXTREMITIES: Bilateral lower extremity-distal aspect of the feet are erythematous, edematous, tender with no pus or discharge NEUROLOGIC: Cranial nerves II through XII are intact.Sensation intact. Gait not checked.  PSYCHIATRIC: The patient is alert and oriented x 3.  SKIN:bilateral hands and feet beefy looking macular rash, non blanching. Bilateral groin area is erythematous  LABORATORY PANEL:   CBC  Recent Labs Lab 11/29/14 0437  WBC 8.7  HGB 8.4*  HCT 26.3*  PLT 92*   ------------------------------------------------------------------------------------------------------------------  Chemistries   Recent Labs Lab 11/26/14 0910  11/28/14 0444 11/29/14 0437  NA 140  --  141  --   K 5.4*  --  4.7  --   CL 98*  --  101  --   CO2 31  --  31  --   GLUCOSE 90  --  106*  --   BUN 77*  --  77*  --   CREATININE 1.93*  < > 1.95* 2.02*  CALCIUM 9.0  --  8.6*  --   AST 48*  --   --   --   ALT 58  --   --   --   ALKPHOS 143*  --   --   --   BILITOT 1.1  --   --   --   < > = values in this interval not displayed. ------------------------------------------------------------------------------------------------------------------  Cardiac Enzymes  Recent Labs Lab 11/26/14 0910  TROPONINI 0.85*   ------------------------------------------------------------------------------------------------------------------  RADIOLOGY:  No results found.  EKG:   Orders placed or performed during the hospital encounter of 11/26/14  . EKG 12-Lead  .  EKG 12-Lead  . EKG 12-Lead  . EKG 12-Lead    ASSESSMENT AND PLAN:    81 year old Mathew Morris with past medical history of CAD stage III-4, small cell lung cancer with resection, history of dysphagia status post G-tube placement in April 2016 will recently was started on Bactrim due to skin infection around the G-tube site comes in  with  increasing shortness of breath fever of 103.6 at home. Patient is being admitted with   #1 acute hypoxic respiratory failure secondary to combination of bilateral pleural effusion with right lower lobe pneumonia. This was confirmed with CT chest although readings shows patient's x-ray looks similar to that of July 2015. Continue IV IV Vanco and Zosyn as patient's critical situation is improving. Follow-up blood cultures sputum cultures. Appreciate Pulmonology consult .  #2 sepsis secondary to pneumonia. Patient presented with fever of 103.6 elevated white count of 17,000 and abnormal chest x-ray. Treatment as above.  #3 elevated troponin suspected due to demand ischemia. Patient does have history of coronary artery disease. cardiac enzyme 3.  #4  bilateral upper and lower extremity rash. No significant improvement of his lower extremity rash. Will continue Zosyn and put a consult to infectious disease. If necessary consider podiatry consult   #5 elevated D dimers- DIC panel reveals a significantly elevated D dimers which are greater than 4000. This can be from the underlying sepsis , d-dimer can be an acute phase reactant in this case . I cannot order CT angiogram of the chest to rule out pulmonary embolism as his renal function is abnormal. VQ scan is going to be indeterminate in view of his pneumonia and pleural effusions. However as the patient's d-dimer is significantly high at greater than 4000 will get an expert opinion from pulmonology. Discussed with Dr. Mortimer Fries, recommended no w/u for PE , suggesting palliative care  #6 chronic dysphagia with G-tube feeding will resume Jevity. S/p speech therapy evaluation- ok with pleasure eating - nectar thick liquids and puree diet prn    #7. CK D stage III with slightly abnormal renal function today. Will provide the gentle hydration with 1 L of IV fluids. Check BMP in a.m.  #8 DVT prophylaxis subcutaneous heparin 3 times a day, while closely monitoring  of his platelet count as he has thrombocytopenia. Today his platelet count is at 91  #9 discharge planning with care Management and social worker     All the records are reviewed and case discussed with Care Management/Social Workerr. Management plans discussed with the patient, family and they are in agreement.  CODE STATUS: full  TOTAL TIME TAKING CARE OF THIS PATIENT: 35 minutes.   POSSIBLE D/C IN 2-3 DAYS, DEPENDING ON CLINICAL CONDITION.   Nicholes Mango M.D on 11/29/2014 at 4:01 PM  Between 7am to 6pm - Pager - [phone] After 6pm go to [email or web address] - password [email or web address] [facility]  [place] [facility]  Office  [phone]  CC: Primary care physician; [place] Pink, MD

## 2014-11-29 NOTE — Progress Notes (Signed)
Physical Therapy Treatment Patient Details Name: Mathew Morris MRN: [id number] DOB: 09-26-1923 Today's Date: 2014-12-16    History of Present Illness 81 yo male with onset of PNA B Lungs, cardiomegaly, pulm edema, severe atherosclerosis is referred to PT.  PMHx: Lung CA, CKD 3, sepsis with hypoxic respiratory failure.    PT Comments    Pt was seen for continuing therapy and was able to perform strengthening exercises with good effort and improved control of hips and knees.  Pt is planning on SNF and wife in agreement.  Will focus on mobility and expect a transition soon.  Follow Up Recommendations  SNF     Equipment Recommendations  None recommended by PT    Recommendations for Other Services       Precautions / Restrictions Precautions Precautions: Fall Restrictions Weight Bearing Restrictions: No Other Position/Activity Restrictions: PEG tube    Mobility  Bed Mobility Overal bed mobility: Needs Assistance             General bed mobility comments: up when PT entered  Transfers Overall transfer level: Needs assistance               General transfer comment: just got OOB and cannot tolerate any more standing  Ambulation/Gait                 Stairs            Wheelchair Mobility    Modified Rankin (Stroke Patients Only)       Balance                                    Cognition Arousal/Alertness: Awake/alert Behavior During Therapy: WFL for tasks assessed/performed Overall Cognitive Status: Within Functional Limits for tasks assessed                      Exercises General Exercises - Lower Extremity Ankle Circles/Pumps: AAROM;Both;5 reps Quad Sets: AROM;Both;5 reps Long Arc Quad: Strengthening;Both;10 reps;AAROM Heel Slides: AAROM;Strengthening;10 reps Hip ABduction/ADduction: AROM;Strengthening;Both;10 reps Hip Flexion/Marching: AAROM;Both;10 reps    General Comments        Pertinent Vitals/Pain       Home Living                      Prior Function            PT Goals (current goals can now be found in the care plan section) Acute Rehab PT Goals Patient Stated Goal: To get better Progress towards PT goals: Progressing toward goals    Frequency  Min 2X/week    PT Plan Current plan remains appropriate    Co-evaluation             End of Session Equipment Utilized During Treatment: Gait belt;Oxygen Activity Tolerance: Patient tolerated treatment well Patient left: in chair;with call bell/phone within reach;with chair alarm set;with family/visitor present     Time: 1500-1530 PT Time Calculation (min) (ACUTE ONLY): 30 min  Charges:  $Therapeutic Exercise: 23-37 mins                    G Codes:      Ramond Dial 12/16/14, 4:41 PM   Mee Hives, PT MS Acute Rehab Dept. Number: [facility] [phone] and [place] [phone]

## 2014-11-29 NOTE — Progress Notes (Signed)
Speech Language Pathology Treatment: Dysphagia  Patient Details Name: Mathew Morris MRN: [id number] DOB: [birth year] Today's Date: 11/29/2014 Time: 3790-2409 SLP Time Calculation (min) (ACUTE ONLY): 35 min  Assessment / Plan / Recommendation Clinical Impression  Pt continues to present w/ overt s/s of aspiration w/ po intake; this is baseline for pt sec. to his pharyngeal phase dysphagia leading to a PEG placement last admission. Pt does request oral po's for pleasure and has been rec'd to take purees and Nectar consistency liquids w/ strict aspiration precautions and monitoring. Rec. Only few pleasure po's at this time to allow for respiratory status to improve as any oral intake has the high likelihood to be aspirated thus impeding his healing process. This was discussed w/ MD/NSG. and SLP will f/u w/ wife when present. Pt continues to receive TFs via PEG to meet all nutrition/hydration needs.   HPI Other Pertinent Information: pt's oral diet has been discontinued at this time to allow for pt to allow for respiratory status to improve/heal and to allow enteral feedings to meet his nutritional needs; any oral po's pose high risk for Aspiration which would impact pt's healing process. NSG reported pt has not been asking for much po's.    Pertinent Vitals Pain Assessment: No/denies pain  SLP Plan  Continue with current plan of care    Recommendations Diet recommendations: Other(comment) (pleasure feeding of puree; Nectar consistency liquids per pt's request.  TFs via PEG as primary nutrition) Liquids provided via: Teaspoon Medication Administration: Other (Comment) (via PEG) Supervision: Full supervision/cueing for compensatory strategies Compensations: Small sips/bites;Slow rate Postural Changes and/or Swallow Maneuvers: Seated upright 90 degrees              Oral Care Recommendations: Oral care BID Follow up Recommendations: Skilled Nursing facility Plan: Continue with current plan of care     GO     Kaleb Sek 11/29/2014, 4:30 PM

## 2014-11-29 NOTE — Care Management Note (Signed)
Case Management Note  Patient Details  Name: Mathew Morris MRN: [id number] Date of Birth: [birth year]  Subjective/Objective:                    Action/Plan:   Expected Discharge Date:    Wife is leaning towards skilled nursing placement.  Patient is having diarrhea stools and has been placed on C Diff precautions               Expected Discharge Plan:  SNF vs Home with home health House Referral:     Discharge planning Services     Post Acute Care Choice:    Choice offered to:     DME Arranged:    DME Agency:     HH Arranged:    [place]:  [place]  Status of Service:     Medicare Important Message Given:   yes  Date Medicare IM Given:   11/29/14 Medicare IM give by:   Joni Reining Date Additional Medicare IM Given:    Additional Medicare Important Message give by:     If discussed at [place] of Stay Meetings, dates discussed:    Additional Comments:  Katrina Stack, RN 11/29/2014, 4:54 PM

## 2014-11-29 NOTE — Plan of Care (Signed)
Problem: Phase I Progression Outcomes Goal: Consider Infectious Disease Consult Outcome: Progressing Consult placed today to infectious disease

## 2014-11-30 ENCOUNTER — Inpatient Hospital Stay: Payer: Medicare Other

## 2014-11-30 LAB — CBC
HEMATOCRIT: 26.2 % — AB (ref 40.0–52.0)
Hemoglobin: 8.6 g/dL — ABNORMAL LOW (ref 13.0–18.0)
MCH: 31.6 pg (ref 26.0–34.0)
MCHC: 32.6 g/dL (ref 32.0–36.0)
MCV: 96.9 fL (ref 80.0–100.0)
PLATELETS: 95 10*3/uL — AB (ref 150–440)
RBC: 2.7 MIL/uL — ABNORMAL LOW (ref 4.40–5.90)
RDW: 20.9 % — AB (ref 11.5–14.5)
WBC: 8.3 10*3/uL (ref 3.8–10.6)

## 2014-11-30 LAB — BASIC METABOLIC PANEL
Anion gap: 9 (ref 5–15)
BUN: 67 mg/dL — ABNORMAL HIGH (ref 6–20)
CALCIUM: 8.4 mg/dL — AB (ref 8.9–10.3)
CO2: 30 mmol/L (ref 22–32)
Chloride: 103 mmol/L (ref 101–111)
Creatinine, Ser: 1.88 mg/dL — ABNORMAL HIGH (ref 0.61–1.24)
GFR calc non Af Amer: 30 mL/min — ABNORMAL LOW (ref >60)
GFR, EST AFRICAN AMERICAN: 35 mL/min — AB (ref >60)
Glucose, Bld: 103 mg/dL — ABNORMAL HIGH (ref 65–99)
POTASSIUM: 4.6 mmol/L (ref 3.5–5.1)
Sodium: 142 mmol/L (ref 135–145)

## 2014-11-30 LAB — GLUCOSE, CAPILLARY
GLUCOSE-CAPILLARY: 110 mg/dL — AB (ref 65–99)
GLUCOSE-CAPILLARY: 170 mg/dL — AB (ref 65–99)
Glucose-Capillary: 99 mg/dL (ref 65–99)
Glucose-Capillary: 141 mg/dL — ABNORMAL HIGH (ref 65–99)
Glucose-Capillary: 128 mg/dL — ABNORMAL HIGH (ref 65–99)

## 2014-11-30 MED ORDER — GELOCAST UNNAS BOOT EX MISC
2.0000 "application " | Freq: Once | CUTANEOUS | Status: DC
  Filled 2014-11-30: qty 2

## 2014-11-30 MED ORDER — NON FORMULARY: 1.0000 "application " | Freq: Once | Status: DC

## 2014-11-30 MED ORDER — TORSEMIDE 20 MG PO TABS
20.0000 mg | ORAL_TABLET | Freq: Two times a day (BID) | ORAL | Status: DC
  Administered 2014-11-30 – 2014-12-01 (×2): 20 mg via ORAL
  Filled 2014-11-30 (×2): qty 1

## 2014-11-30 NOTE — Progress Notes (Signed)
[place] at [place] NAME: Mathew Morris    MR#:  [id number]  DATE OF BIRTH:  07-03-1924  SUBJECTIVE:  CHIEF COMPLAINT:   Chief Complaint  Patient presents with  . Code Sepsis    Review of Systems  Constitutional: Positive for malaise/fatigue. Negative for fever, chills and weight loss.  HENT: Negative for hearing loss.   Eyes: Negative for blurred vision.  Respiratory: Positive for cough and shortness of breath. Negative for hemoptysis and sputum production.   Cardiovascular: Negative for chest pain and palpitations.  Gastrointestinal: Negative for heartburn.  Genitourinary: Negative for dysuria and urgency.  Musculoskeletal: Negative for myalgias.  Skin: Positive for rash.  Neurological: Negative for dizziness and headaches.  Psychiatric/Behavioral: Negative for depression.  c/o tremor, weakness, leg swelling, some redness in between toes, subsiding now.   VITAL SIGNS: Blood pressure 124/61, pulse 68, temperature 98 F (36.7 C), temperature source Oral, resp. rate 16, height '5\' 11"'$  (1.803 m), weight 91.264 kg (201 lb 3.2 oz), SpO2 100 %.  PHYSICAL EXAMINATION:   GENERAL:  81 y.o.-year-old patient lying in the bed with no acute distress. Somnolent EYES: Pupils equal, round, reactive to light and accommodation. No scleral icterus. Extraocular muscles intact.  HEENT: Head atraumatic, normocephalic. Oropharynx and nasopharynx clear.  NECK:  Supple, no jugular venous distention. No thyroid enlargement, no tenderness.  LUNGS: Normal breath sounds bilaterally, no wheezing,  Has rales,  Crepitations at the bases b/l. No use of accessory muscles of respiration.  CARDIOVASCULAR: S1, S2 normal. No murmurs, rubs, or gallops.  ABDOMEN: Soft, nontender, nondistended. Bowel sounds present. No organomegaly or mass. Feeding tube in place  EXTREMITIES: Significant pedal edema, no cyanosis, or clubbing. Erythema in feet b/l with between toe  ulcerations, fussues NEUROLOGIC: Cranial nerves II through XII are intact. Muscle strength 5/5 in all extremities. Sensation intact. Gait not checked.  PSYCHIATRIC: The patient is alert and oriented x 3.  SKIN: erythema in LE, mostly feet and between toes  ORDERS/RESULTS REVIEWED:   CBC  Recent Labs Lab 11/26/14 0910 11/26/14 1635 11/27/14 0419 11/28/14 0444 11/29/14 0437 11/30/14 0527  WBC 17.3* 16.2* 10.5 9.3 8.7 8.3  HGB 9.1* 8.9* 8.5* 8.7* 8.4* 8.6*  HCT 28.5* 27.8* 26.3* 26.7* 26.3* 26.2*  PLT 117* 102* 86* 91* 92* 95*  MCV 94.9 95.8 96.4 96.3 96.5 96.9  MCH 30.3 30.8 31.0 31.2 30.9 31.6  MCHC 31.9* 32.1 32.2 32.4 32.1 32.6  RDW 20.2* 20.3* 20.1* 20.6* 20.2* 20.9*  LYMPHSABS 0.4*  --   --   --   --   --   MONOABS 0.5  --   --   --   --   --   EOSABS 0.0  --   --   --   --   --   BASOSABS 0.0  --   --   --   --   --    ------------------------------------------------------------------------------------------------------------------  Chemistries   Recent Labs Lab 11/26/14 0910 11/26/14 1635 11/27/14 0419 11/28/14 0444 11/29/14 0437 11/30/14 0527  NA 140  --   --  141  --  142  K 5.4*  --   --  4.7  --  4.6  CL 98*  --   --  101  --  103  CO2 31  --   --  31  --  30  GLUCOSE 90  --   --  106*  --  103*  BUN 77*  --   --  77*  --  67*  CREATININE 1.93* 1.80* 1.87* 1.95* 2.02* 1.88*  CALCIUM 9.0  --   --  8.6*  --  8.4*  AST 48*  --   --   --   --   --   ALT 58  --   --   --   --   --   ALKPHOS 143*  --   --   --   --   --   BILITOT 1.1  --   --   --   --   --    ------------------------------------------------------------------------------------------------------------------ estimated creatinine clearance is 30.8 mL/min (by C-G formula based on Cr of 1.88). ------------------------------------------------------------------------------------------------------------------ No results for input(s): TSH, T4TOTAL, T3FREE, THYROIDAB in the last 72 hours.  Invalid  input(s): FREET3  Cardiac Enzymes  Recent Labs Lab 11/26/14 0910  TROPONINI 0.85*   ------------------------------------------------------------------------------------------------------------------ Invalid input(s): POCBNP ---------------------------------------------------------------------------------------------------------------  RADIOLOGY: No results found.  EKG:  Orders placed or performed during the hospital encounter of 11/26/14  . EKG 12-Lead  . EKG 12-Lead  . EKG 12-Lead  . EKG 12-Lead    ASSESSMENT AND PLAN: 81 year old Dyan Creelman with past medical history of CAD stage III-4, small cell lung cancer with resection, history of dysphagia status post G-tube placement in April 2016 will recently was started on Bactrim due to skin infection around the G-tube site comes in with increasing shortness of breath fever of 103.6 at home. Patient is being admitted with   #1 acute hypoxic respiratory failure secondary to combination of bilateral pleural effusion with right lower lobe pneumonia. This was confirmed with CT chest although readings shows patient's x-ray looks similar to that of July 2015. Continue IV IV Vanco and Zosyn , patient's critical situation is improving. Blood cultures, urine cx NTD,  sputum cultures not reported. Appreciate Pulmonology consult .  #2 sepsis secondary to pneumonia. Patient presented with fever of 103.6 elevated white count of 17,000 and abnormal chest x-ray. Treatment as above.  #3 elevated troponin suspected due to demand ischemia. Patient does have history of coronary artery disease. cardiac enzyme 3.  #4 bilateral upper and lower extremity rash. Suspected cellulitis vs allergic reaction to bactrim. Some  improvement of his lower extremity rash. Will continue Zosyn and placed a consult to infectious disease.   #5 elevated D dimers- DIC panel reveals a significantly elevated D dimers which are greater than 4000. This can be from the  underlying sepsis , d-dimer can be an acute phase reactant in this case . I cannot order CT angiogram of the chest to rule out pulmonary embolism as his renal function is abnormal. VQ scan is going to be indeterminate in view of his pneumonia and pleural effusions. However as the patient's d-dimer is significantly high at greater than 4000, appreciate  opinion from pulmonology. Discussed with Dr. Mortimer Fries, recommended no w/u for PE , suggested palliative care, wife is not willing yet to d/w palliative care  #6 chronic dysphagia with G-tube feeding , being continued on Jevity. S/p speech therapy evaluation- ok with pleasure eating - nectar thick liquids and puree diet prn only  #7. CK D stage III , off IV fluids now, needs diuretics, not IVF at present, suspect fluid overload in lungs as well. Check BMP in a.m. Advance Torsemide to bid, chest xray to be repeated.  #8 DVT prophylaxis subcutaneous heparin 3 times a day, while closely monitoring of his platelet count as he has thrombocytopenia. Today his platelet count is at 95 now, stable  #  9 discharge planning with care Management and social worker, likely rehab  10 OSA, OK to use CPAP machine from home  Management plans discussed with the patient, family and they are in agreement.   DRUG ALLERGIES: No Known Allergies  CODE STATUS:     Code Status Orders        Start     Ordered   11/26/14 1459  Full code   Continuous     11/26/14 1501    Advance Directive Documentation        Most Recent Value   Type of Advance Directive  Healthcare Power of Attorney   Pre-existing out of facility DNR order (yellow form or pink MOST form)     "MOST" Form in Place?        TOTAL TIME TAKING CARE OF THIS PATIENT: 60  minutes.  Very long discussion with patient and family, his wife , all questions answered, voiced Sherilyn Banker M.D on 11/30/2014 at 2:30 PM  Between 7am to 6pm - Pager - [phone]  After 6pm go to [email or web address] -  password [email or web address] [facility]  [place] [facility]  Office  [phone]  CC: Primary care physician; [place] Pink, MD

## 2014-11-30 NOTE — Progress Notes (Signed)
Notified Dr. Reece Levy of bladder scan results of over 200. I&O cath ordered.

## 2014-11-30 NOTE — Progress Notes (Signed)
   11/30/14 1000  Clinical Encounter Type  Visited With Patient  Visit Type Spiritual support  Referral From Nurse  Consult/Referral To Chaplain  Spiritual Encounters  Spiritual Needs Prayer  Stress Factors  Patient Stress Factors Health changes  Family Stress Factors Health changes  Advance Directives (For Healthcare)  Does patient have an advance directive? Yes  Type of Advance Directive [place]  Does patient want to make changes to advanced directive? No - Patient declined  Copy of advanced directive(s) in chart? No - copy requested   Chaplain provided patient with therapeutic presence, emotional support and empathic listening. Chaplain prayed with patient who was thankful for the visit.   AD [phone]

## 2014-11-30 NOTE — Progress Notes (Signed)
A&O. Up in chair all night. IV antibiotics given. 2L O2. No s/s distress. No complaints. Gtube in place. No residuals. Placement checked. Tolerated night time feeding well. IVF stopped per order.

## 2014-12-01 LAB — CULTURE, BLOOD (ROUTINE X 2)
Culture: NO GROWTH
Culture: NO GROWTH

## 2014-12-01 LAB — GLUCOSE, CAPILLARY
GLUCOSE-CAPILLARY: 108 mg/dL — AB (ref 65–99)
GLUCOSE-CAPILLARY: 85 mg/dL (ref 65–99)
Glucose-Capillary: 104 mg/dL — ABNORMAL HIGH (ref 65–99)
Glucose-Capillary: 132 mg/dL — ABNORMAL HIGH (ref 65–99)
Glucose-Capillary: 151 mg/dL — ABNORMAL HIGH (ref 65–99)
Glucose-Capillary: 174 mg/dL — ABNORMAL HIGH (ref 65–99)

## 2014-12-01 MED ORDER — TORSEMIDE 20 MG PO TABS
40.0000 mg | ORAL_TABLET | Freq: Two times a day (BID) | ORAL | Status: DC
  Administered 2014-12-01 – 2014-12-03 (×4): 40 mg via ORAL
  Filled 2014-12-01 (×4): qty 2

## 2014-12-01 MED ORDER — VANCOMYCIN HCL IN DEXTROSE 1-5 GM/200ML-% IV SOLN
1000.0000 mg | INTRAVENOUS | Status: DC
  Administered 2014-12-03: 07:00:00 1000 mg via INTRAVENOUS
  Filled 2014-12-01: qty 200

## 2014-12-01 NOTE — Progress Notes (Addendum)
Pt quiet- slept most of the day in chair.  Pt now moved to bed. Vitals stable. No complaint of pain.  Bladder scan showing 467m.  Urine output adequate at 9073mat this time.  Wife at bedside. Continuing antibiotics.

## 2014-12-01 NOTE — Progress Notes (Signed)
[facility] [place] NAME: Mathew Morris    MR#:  [id number]  DATE OF BIRTH:  01-22-1924  SUBJECTIVE:  CHIEF COMPLAINT:   Chief Complaint  Patient presents with  . Code Sepsis   patient is confused and not able to provide much history today and no review of systems was not available.  Patient felt that Unna boots were placed at unknown period of time.  Review of Systems  Unable to perform ROS c/o tremor, weakness, leg swelling, some redness in between toes, subsiding now.   VITAL SIGNS: Blood pressure 145/74, pulse 91, temperature 97.5 F (36.4 C), temperature source Oral, resp. rate 22, height '5\' 11"'$  (1.803 m), weight 91.264 kg (201 lb 3.2 oz), SpO2 99 %.  PHYSICAL EXAMINATION:   GENERAL:  81 y.o.-year-old patient lying in the bed with no acute distress. Somnolent, converses but very slow in his responses and his voice is low. EYES: Pupils equal, round, reactive to light and accommodation. No scleral icterus. Extraocular muscles intact.  HEENT: Head atraumatic, normocephalic. Oropharynx and nasopharynx clear.  NECK:  Supple, no jugular venous distention. No thyroid enlargement, no tenderness.  LUNGS: Diminished breath sounds bilaterally, no wheezing,  Has rales,  less crepitations at the bases b/l. No use of accessory muscles of respiration.  CARDIOVASCULAR: S1, S2 normal. No murmurs, rubs, or gallops.  ABDOMEN: Soft, nontender, nondistended. Bowel sounds present. No organomegaly or mass. Feeding tube in place  EXTREMITIES: Significant pedal edema, no cyanosis, or clubbing. Erythema in feet b/l with between toe ulcerations, fussues NEUROLOGIC: Cranial nerves II through XII are intact. Muscle strength 5/5 in all extremities. Sensation intact. Gait not checked.  PSYCHIATRIC: The patient is alert and oriented x 3.  SKIN: erythema in LE, mostly feet and between toes  ORDERS/RESULTS REVIEWED:   CBC  Recent Labs Lab 11/26/14 0910  11/26/14 1635 11/27/14 0419 11/28/14 0444 11/29/14 0437 11/30/14 0527  WBC 17.3* 16.2* 10.5 9.3 8.7 8.3  HGB 9.1* 8.9* 8.5* 8.7* 8.4* 8.6*  HCT 28.5* 27.8* 26.3* 26.7* 26.3* 26.2*  PLT 117* 102* 86* 91* 92* 95*  MCV 94.9 95.8 96.4 96.3 96.5 96.9  MCH 30.3 30.8 31.0 31.2 30.9 31.6  MCHC 31.9* 32.1 32.2 32.4 32.1 32.6  RDW 20.2* 20.3* 20.1* 20.6* 20.2* 20.9*  LYMPHSABS 0.4*  --   --   --   --   --   MONOABS 0.5  --   --   --   --   --   EOSABS 0.0  --   --   --   --   --   BASOSABS 0.0  --   --   --   --   --    ------------------------------------------------------------------------------------------------------------------  Chemistries   Recent Labs Lab 11/26/14 0910 11/26/14 1635 11/27/14 0419 11/28/14 0444 11/29/14 0437 11/30/14 0527  NA 140  --   --  141  --  142  K 5.4*  --   --  4.7  --  4.6  CL 98*  --   --  101  --  103  CO2 31  --   --  31  --  30  GLUCOSE 90  --   --  106*  --  103*  BUN 77*  --   --  77*  --  67*  CREATININE 1.93* 1.80* 1.87* 1.95* 2.02* 1.88*  CALCIUM 9.0  --   --  8.6*  --  8.4*  AST 48*  --   --   --   --   --  ALT 58  --   --   --   --   --   ALKPHOS 143*  --   --   --   --   --   BILITOT 1.1  --   --   --   --   --    ------------------------------------------------------------------------------------------------------------------ estimated creatinine clearance is 30.8 mL/min (by C-G formula based on Cr of 1.88). ------------------------------------------------------------------------------------------------------------------ No results for input(s): TSH, T4TOTAL, T3FREE, THYROIDAB in the last 72 hours.  Invalid input(s): FREET3  Cardiac Enzymes  Recent Labs Lab 11/26/14 0910  TROPONINI 0.85*   ------------------------------------------------------------------------------------------------------------------ Invalid input(s):  POCBNP ---------------------------------------------------------------------------------------------------------------  RADIOLOGY: Dg Chest 2 View  11/30/2014   CLINICAL DATA:  COPD, CHF, history of lung cancer.  EXAM: CHEST  2 VIEW  COMPARISON:  11/26/2014 and 10/22/2014 as well as chest CT 11/26/2014  FINDINGS: Lungs are adequately inflated with stable opacification the left base likely moderate size effusion with atelectasis as there is less of a loculated component laterally. Worsening right base opacification likely worsening small to moderate right pleural effusion with associated atelectasis. Cannot exclude infection in the lung bases. Cardiomediastinal silhouette and remainder the exam is unchanged.  IMPRESSION: Stable left base opacification compatible with moderate effusion with atelectasis. Less loculation laterally. Worsening small to moderate right pleural effusion likely with associated atelectasis. Cannot exclude infection in the lung bases.   Electronically Signed   By: Marin Olp M.D.   On: 11/30/2014 14:52    EKG:  Orders placed or performed during the hospital encounter of 11/26/14  . EKG 12-Lead  . EKG 12-Lead  . EKG 12-Lead  . EKG 12-Lead    ASSESSMENT AND PLAN: 81 year old Mathew Morris with past medical history of CAD stage III-4, small cell lung cancer with resection, history of dysphagia status post G-tube placement in April 2016 will recently was started on Bactrim due to skin infection around the G-tube site comes in with increasing shortness of breath fever of 103.6 at home. Patient is being admitted with   #1 acute hypoxic respiratory failure secondary to combination of bilateral pleural effusion with suspected right lower lobe pneumonia. This was confirmed with CT chest although readings shows patient's x-ray looks similar to that of July 2015. Continue IV IV Vanco and Zosyn , patient's critical situation is improving. Blood cultures, urine cx NTD,  sputum cultures  not reported. Appreciate Pulmonology consult . Advance diuretic today and watch patient's in's and outs following his oxygenation and shortness of breath.  #2 sepsis secondary to pneumonia. Patient presented with fever of 103.6 elevated white count of 17,000 and abnormal chest x-ray. Treatment as above. Sputum cultures are not reported. White blood cell count has normalized.   #3 elevated troponin suspected due to demand ischemia. Patient does have history of coronary artery disease. cardiac enzyme 3 done, but significant increase was not noted, no further interventions recommended.  #4 bilateral upper and lower extremity rash. Suspected cellulitis vs allergic reaction to bactrim. Significant improvement of his lower extremity rash. Will continue Zosyn and a consult was placed to infectious disease  M.D.   #5 elevated D dimers- DIC panel reveals a significantly elevated D dimers which are greater than 4000. This can be from the underlying sepsis , d-dimer can be an acute phase reactant in this case . I cannot order CT angiogram of the chest to rule out pulmonary embolism as his renal function is abnormal. VQ scan is going to be indeterminate in view of his pneumonia and pleural  effusions. However as the patient's d-dimer is significantly high at greater than 4000, appreciate  opinion from pulmonology. Discussed with Dr. Mortimer Fries, recommended no w/u for PE , suggested palliative care, wife is not willing yet to d/w palliative care  #6 chronic dysphagia with G-tube feeding , being continued on Jevity. S/p speech therapy evaluation- ok with pleasure eating - nectar thick liquids and puree diet prn only  #7. CK D stage III , off IV fluids now, advance diuretics, not IVF at present, as patient has fluid overload in lungs. Check BMP in a.m. Advance Torsemide to 3 times a day, chest xray repeated, showed worsening pleural effusions.  #8 DVT prophylaxis subcutaneous heparin 3 times a day, while closely  monitoring of his platelet count as he has thrombocytopenia. Today his platelet count is at 95 now, stable  #9 discharge planning with care Management and social worker, likely rehab   10 OSA, OK to use CPAP machine from home  11 . Bilateral pleural effusions, likely  heart failure related. Also cannot rule out CK D related to fluid retention. We will be initiating diuretics at the higher rate watching patient's in's and outs as well as his kidney function.  Management plans discussed with the patient, family and they are in agreement.   DRUG ALLERGIES: No Known Allergies  CODE STATUS:     Code Status Orders        Start     Ordered   11/26/14 1459  Full code   Continuous     11/26/14 1501    Advance Directive Documentation        Most Recent Value   Type of Advance Directive  Healthcare Power of Attorney   Pre-existing out of facility DNR order (yellow form or pink MOST form)     "MOST" Form in Place?        TOTAL TIME TAKING CARE OF THIS PATIENT: 63mnutes.    VTheodoro GristM.D on 12/01/2014 at 10:02 AM  Between 7am to 6pm - Pager - [phone]  After 6pm go to [email or web address] - password [email or web address] [facility]ton[facility]ospital[facility]  Office  [phone] CC: Primary care physician; HMaryland Pink MD

## 2014-12-01 NOTE — Progress Notes (Signed)
Previously set patient up with hospital cpap unit. Placed on cpap of 10. Patient stated after using his he found was not working properly. Home cpap at bedside. Tolerated hospital unit well.

## 2014-12-02 DIAGNOSIS — I270 Primary pulmonary hypertension: Secondary | ICD-10-CM

## 2014-12-02 DIAGNOSIS — Z515 Encounter for palliative care: Secondary | ICD-10-CM

## 2014-12-02 DIAGNOSIS — J449 Chronic obstructive pulmonary disease, unspecified: Secondary | ICD-10-CM

## 2014-12-02 DIAGNOSIS — J189 Pneumonia, unspecified organism: Secondary | ICD-10-CM

## 2014-12-02 LAB — BASIC METABOLIC PANEL
Anion gap: 6 (ref 5–15)
BUN: 68 mg/dL — AB (ref 6–20)
CALCIUM: 8.8 mg/dL — AB (ref 8.9–10.3)
CO2: 33 mmol/L — ABNORMAL HIGH (ref 22–32)
CREATININE: 1.75 mg/dL — AB (ref 0.61–1.24)
Chloride: 106 mmol/L (ref 101–111)
GFR calc Af Amer: 38 mL/min — ABNORMAL LOW (ref >60)
GFR calc non Af Amer: 33 mL/min — ABNORMAL LOW (ref >60)
Glucose, Bld: 129 mg/dL — ABNORMAL HIGH (ref 65–99)
Potassium: 5.4 mmol/L — ABNORMAL HIGH (ref 3.5–5.1)
Sodium: 145 mmol/L (ref 135–145)

## 2014-12-02 LAB — VANCOMYCIN, TROUGH: Vancomycin Tr: 21 ug/mL — ABNORMAL HIGH (ref 10.0–20.0)

## 2014-12-02 LAB — GLUCOSE, CAPILLARY
GLUCOSE-CAPILLARY: 90 mg/dL (ref 65–99)
GLUCOSE-CAPILLARY: 157 mg/dL — AB (ref 65–99)
Glucose-Capillary: 137 mg/dL — ABNORMAL HIGH (ref 65–99)
Glucose-Capillary: 148 mg/dL — ABNORMAL HIGH (ref 65–99)

## 2014-12-02 LAB — BLOOD GAS, ARTERIAL
ACID-BASE EXCESS: 7 mmol/L — AB (ref 0.0–3.0)
Allens test (pass/fail): POSITIVE — AB
Bicarbonate: 36.7 mEq/L — ABNORMAL HIGH (ref 21.0–28.0)
DRAWN BY: 381731
FIO2: 0.36 %
O2 Saturation: 87.1 %
PCO2 ART: 78 mmHg — AB (ref 32.0–48.0)
Patient temperature: 37
pH, Arterial: 7.28 — ABNORMAL LOW (ref 7.350–7.450)
pO2, Arterial: 60 mmHg — ABNORMAL LOW (ref 83.0–108.0)

## 2014-12-02 LAB — POTASSIUM: Potassium: 5.2 mmol/L — ABNORMAL HIGH (ref 3.5–5.1)

## 2014-12-02 LAB — HEMOGLOBIN: HEMOGLOBIN: 9.1 g/dL — AB (ref 13.0–18.0)

## 2014-12-02 MED ORDER — ASPIRIN EC 325 MG PO TBEC
325.0000 mg | DELAYED_RELEASE_TABLET | Freq: Every day | ORAL | Status: DC
  Filled 2014-12-02: qty 1

## 2014-12-02 NOTE — Plan of Care (Signed)
Problem: Phase I Progression Outcomes Goal: Consider pulmonary consult Outcome: Progressing Pulmonary consult ordered. Pending completion.  Problem: Phase III Progression Outcomes Goal: Convert IV antibiotics to PO Outcome: Not Met (add Reason) Pt continues on IVABT  Problem: Discharge Progression Outcomes Goal: Discharge plan in place and appropriate Outcome: Progressing Individualization of care Pt likes to be called Mr [place] Lives at home with spouse Pt has a hx of dysphagia- on bolus TF, COPD, CKD, anemia, small lung ca, hypertension and sleep apnea (pt utilizes own Cpap). Pt co-morbidities are currently medically managed by home medications.  Goal: Activity appropriate for discharge plan Outcome: Progressing Plan of care progress to goal:  Barriers to progression: pt still on iv antibiotics, O2 at 4 liters. Poor mobility, needs two people to assist with transfers.  O2 sats: currently patient on O2 at 4 liters. Will attempt to wean. Pain control: patient denied pain this shift. Tolerating diet: Pt had one can of Jevity per bolus since being transferred from 2A. He tolerated the bolus well with only 27ms of residual.

## 2014-12-02 NOTE — Consult Note (Signed)
[facility] Consultation Note  Patient ID: Mathew Morris, MRN: [id number], DOB/AGE: Dec 17, 1923 81 y.o. Admit date: 11/26/2014   Date of Consult: 12/02/2014 Primary Physician: Maryland Pink, MD Primary Cardiologist: [facility]  Chief Complaint:  Chief Complaint  Patient presents with  . Code Sepsis   Reason for Consult: acute on chronic systolic dysfunction congestive heart failure with elevated troponin  HPI: 81 y.o. male with known systolic dysfunction congestive heart failure with ejection fraction of 30% having an acute on chronic systolic dysfunction congestive heart failure multifactorial in nature including stage II chronic obstructive pulmonary disease with respiratory failure sepsis and bilateral pneumonia with effusion sleep apnea and chronic kidney disease. The patient was admitted to the intensive care unit for which the patient acute care. The patient did have significant hypoxia causing elevated troponin most consistent with demand ischemia rather than acute coronary syndrome. The patient was having some tachycardia likely due to the symptoms sepsis and no other further problem. 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 There is continued pleural effusions and pulmonary edema with lower extremity edema likely secondary to multiple factors above but reasonably well with current medical regimen including diuretics as necessary. Additionally the patient has had appropriate treatment of previous congestive heart failure with carvedilol. Plavix has been used for previous coronary artery atherosclerosis concerns and peripheral vascular disease concerns and has had no current evidence of an stroke and/or myocardial infarction. The patient does have significant issues with failure to thrive after this recent event listed above.  Past Medical History  Diagnosis Date  . CHF (congestive heart  failure)   . Dysphagia   . Anemia   . Hypothyroid   . Hypertension   . Enlarged prostate   . Chronic kidney disease   . Arthritis   . Hyperlipidemia   . GERD (gastroesophageal reflux disease)   . Gout   . Lung cancer   . Obstructive sleep apnea       Surgical History:  Past Surgical History  Procedure Laterality Date  . Cardiac catheterization    . Lobectomy Left   . Thyroidectomy    . Bilateral carpal tunnel release Bilateral      Home Meds: Prior to Admission medications   Medication Sig Start Date End Date Taking? Authorizing Provider  allopurinol (ZYLOPRIM) 100 MG tablet Take 100 mg by mouth 2 (two) times daily.   Yes Historical Provider, MD  aspirin 325 MG tablet Take 325 mg by mouth daily.   Yes Historical Provider, MD  atorvastatin (LIPITOR) 10 MG tablet Take 10 mg by mouth daily.   Yes Historical Provider, MD  carboxymethylcellulose (REFRESH PLUS) 0.5 % SOLN Place 1 drop into both eyes 2 (two) times daily as needed.   Yes Historical Provider, MD  carvedilol (COREG) 6.25 MG tablet Take 6.25 mg by mouth 2 (two) times daily with a meal.   Yes Historical Provider, MD  clopidogrel (PLAVIX) 75 MG tablet Take 75 mg by mouth daily.   Yes Historical Provider, MD  ferrous sulfate 325 (65 FE) MG tablet Take 325 mg by mouth 2 (two) times daily with a meal.   Yes Historical Provider, MD  finasteride (PROSCAR) 5 MG tablet Take 5 mg by mouth daily.   Yes Historical Provider, MD  nitroGLYCERIN (NITRODUR - DOSED IN MG/24 HR) 0.4 mg/hr patch Place 0.4 mg onto the skin daily.   Yes Historical Provider, MD  nitroGLYCERIN (NITROSTAT) 0.4 MG SL tablet Place 0.4 mg under the tongue every 5 (five) minutes as needed for chest pain.   Yes Historical Provider, MD  potassium chloride (KLOR-CON) 20 MEQ packet Take 10 mEq by mouth daily.   Yes Historical Provider, MD  spironolactone (ALDACTONE) 25 MG tablet Take 25 mg by mouth daily.   Yes Historical Provider, MD  sulfamethoxazole-trimethoprim  (BACTRIM DS,SEPTRA DS) 800-160 MG per tablet Take 1 tablet by mouth daily. Give dose after Hemodialysis days   Yes Historical Provider, MD  tamsulosin (FLOMAX) 0.4 MG CAPS capsule Take 0.4 mg by mouth daily.   Yes Historical Provider, MD  torsemide (DEMADEX) 20 MG tablet Take 20 mg by mouth daily as needed (for edema or weight gain).   Yes Historical Provider, MD  dexlansoprazole (DEXILANT) 60 MG capsule Take 60 mg by mouth daily.    Historical Provider, MD  dronabinol (MARINOL) 5 MG capsule Take 5 mg by mouth 2 (two) times daily before a meal.    Historical Provider, MD  HYDROcodone-acetaminophen (NORCO/VICODIN) 5-325 MG per tablet Take 1 tablet by mouth every 6 (six) hours as needed for moderate pain.    Historical Provider, MD  isosorbide mononitrate (IMDUR) 120 MG 24 hr tablet Take 120 mg by mouth daily as needed (for chest pain).    Historical Provider, MD  ramipril (ALTACE) 2.5 MG capsule Take 2.5 mg by mouth daily.    Historical Provider, MD    Inpatient Medications:  . allopurinol  100 mg Oral BID  . aspirin EC  325 mg Oral Daily  . atorvastatin  10 mg Oral Daily  . carvedilol  6.25 mg Oral BID WC  . clopidogrel  75 mg Oral Daily  . dronabinol  5 mg Oral BID AC  . feeding supplement (JEVITY 1.2 CAL)  237 mL Per Tube Q0600  . feeding supplement (JEVITY 1.2 CAL)  480 mL Per Tube TID  . ferrous sulfate  325 mg Oral BID WC  . finasteride  5 mg Oral Daily  . GELOCAST UNNAS BOOT  2 application Apply externally Once  . heparin  5,000 Units Subcutaneous 3 times per day  . nitroGLYCERIN  0.4 mg Transdermal Daily  . pantoprazole  40 mg Oral Daily  . piperacillin-tazobactam (ZOSYN)  IV  3.375 g Intravenous 3 times per day  . senna  1 tablet Oral BID  . spironolactone  25 mg Oral Daily  . tamsulosin  0.4 mg Oral Daily  . torsemide  40 mg Oral BID  . vancomycin  1,250 mg Intravenous Once  . [START ON 01/01/2015] vancomycin  1,000 mg Intravenous Q36H      Allergies: No Known  Allergies  History   Social History  . Marital Status: Married    Spouse Name: N/A  . Number of Children: N/A  . Years of Education: N/A   Occupational History  . Not on file.   Social History Main Topics  . Smoking status: Former Smoker    Quit date: 11/12/1983  . Smokeless tobacco: Never Used  . Alcohol Use: No  . Drug Use: No  . Sexual Activity: Not on file   Other Topics Concern  . Not on file   Social History Narrative     Family History  Problem Relation Age of Onset  . Stomach cancer Father   . Breast cancer Sister   . COPD Sister      Review of Systems Positive for shortness of breath weakness and fatigue Negative  for: Cannot review all of them due to poor communication Labs: No results for input(s): CKTOTAL, CKMB, TROPONINI in the last 72 hours. Lab Results  Component Value Date   WBC 8.3 11/30/2014   HGB 9.1* 12/02/2014   HCT 26.2* 11/30/2014   MCV 96.9 11/30/2014   PLT 95* 11/30/2014    Recent Labs Lab 11/26/14 0910  12/02/14 0426  NA 140  < > 145  K 5.4*  < > 5.4*  CL 98*  < > 106  CO2 31  < > 33*  BUN 77*  < > 68*  CREATININE 1.93*  < > 1.75*  CALCIUM 9.0  < > 8.8*  PROT 7.9  --   --   BILITOT 1.1  --   --   ALKPHOS 143*  --   --   ALT 58  --   --   AST 48*  --   --   GLUCOSE 90  < > 129*  < > = values in this interval not displayed. No results found for: CHOL, HDL, LDLCALC, TRIG No results found for: DDIMER  Radiology/Studies:  Ct Abdomen Pelvis Wo Contrast  11/26/2014   CLINICAL DATA:  81 year old male with sepsis, fever of unknown origin, chronic kidney disease. Initial encounter.  EXAM: CT CHEST, ABDOMEN AND PELVIS WITHOUT CONTRAST  TECHNIQUE: Multidetector CT imaging of the chest, abdomen and pelvis was performed following the standard protocol without IV contrast.  COMPARISON:  Chest CT 01/24/2014. Renal ultrasound 10/12/2014. Abdomen MRI 10/17/2006. Chest CT 06/10/2014.  FINDINGS: CT CHEST FINDINGS  Moderate bilateral pleural  effusions, layering on the right and at least partially loculated on the left. Superimposed widespread pulmonary septal thickening, patchy peribronchovascular ground-glass opacity in the upper lobes, and lower lobe peribronchial thickening and some lower lobe consolidation worse on the right. Major airways are patent. The appearance of the lungs and pleura is very similar to that in July 2015, but was resolved on the intervening 06/10/2014 chest CT.  Incidental intravenous gas at the right subclavian and IJ. Cardiomegaly with aortic and coronary artery calcified plaque. No pericardial effusion.  Degenerative changes at both sternoclavicular joints appear stable. Mediastinal lymph nodes are mildly increased and appear reactive in nature, including an 11 mm prevascular node on series 4, image 20.  Diffuse degenerative changes and widespread ankylosis in the spine. Bulky ste lower cervical and thoracic flowing osteophytes. No acute osseous abnormality identified.  CT ABDOMEN AND PELVIS FINDINGS  Lower thoracic and lumbar flowing osteophytes with widespread ankylosis. Superimposed advanced lumbar disc and endplate degeneration. Partial ankylosis of both SI joints. Moderate to severe degenerative changes at both hips with subchondral cysts. No acute osseous abnormality identified.  Widespread calcified atherosclerosis including involvement of the abdominal aorta and its branches.  Widespread body wall subcutaneous fat stranding.  Foley catheter in place, the bladder is decompressed. Mild presacral stranding. Trace pelvic free fluid (series 4, image 110). The rectum is decompressed and unremarkable.  Redundant sigmoid colon with gas and stool. More decompressed left colon with mild diverticulosis and retained stool. Redundant transverse colon with retained stool. Diverticulosis in the right colon with retained stool. Normal appendix. The cecum is mostly located in the pelvis. Oral contrast has just reached the terminal  ileum. No dilated or abnormal small bowel loops identified. Percutaneous gastrostomy tube. Contrast in the stomach and duodenum with no adverse features.  Negative non contrast liver, gallbladder, spleen, pancreas, and adrenal glands. No hydronephrosis. Diminutive appearance of both kidneys. 12 mm lobulated calcification at  the left renal hilum could be in the collecting system or other dystrophic nephrocalcinosis. No hydroureter. No abdominal free fluid identified. No free air. No lymphadenopathy. Ectatic common iliac arteries.  IMPRESSION: 1. Moderate size layering right and loculated left pleural effusions with bilateral pneumonia. The appearance of the chest is similar to that in July 2015. 2. There may be superimposed pulmonary edema, with evidence of some body wall edema diffusely. And volume overload might also explain trace pelvic free fluid. 3. No other acute or inflammatory process identified. 4. Extensive atherosclerosis. Cardiomegaly. Severe diffuse idiopathic skeletal hyperostosis.   Electronically Signed   By: Genevie Ann M.D.   On: 11/26/2014 14:37   Dg Chest 2 View  11/30/2014   CLINICAL DATA:  COPD, CHF, history of lung cancer.  EXAM: CHEST  2 VIEW  COMPARISON:  11/26/2014 and 10/22/2014 as well as chest CT 11/26/2014  FINDINGS: Lungs are adequately inflated with stable opacification the left base likely moderate size effusion with atelectasis as there is less of a loculated component laterally. Worsening right base opacification likely worsening small to moderate right pleural effusion with associated atelectasis. Cannot exclude infection in the lung bases. Cardiomediastinal silhouette and remainder the exam is unchanged.  IMPRESSION: Stable left base opacification compatible with moderate effusion with atelectasis. Less loculation laterally. Worsening small to moderate right pleural effusion likely with associated atelectasis. Cannot exclude infection in the lung bases.   Electronically Signed    By: Marin Olp M.D.   On: 11/30/2014 14:52   Ct Chest Wo Contrast  11/26/2014   CLINICAL DATA:  81 year old male with sepsis, fever of unknown origin, chronic kidney disease. Initial encounter.  EXAM: CT CHEST, ABDOMEN AND PELVIS WITHOUT CONTRAST  TECHNIQUE: Multidetector CT imaging of the chest, abdomen and pelvis was performed following the standard protocol without IV contrast.  COMPARISON:  Chest CT 01/24/2014. Renal ultrasound 10/12/2014. Abdomen MRI 10/17/2006. Chest CT 06/10/2014.  FINDINGS: CT CHEST FINDINGS  Moderate bilateral pleural effusions, layering on the right and at least partially loculated on the left. Superimposed widespread pulmonary septal thickening, patchy peribronchovascular ground-glass opacity in the upper lobes, and lower lobe peribronchial thickening and some lower lobe consolidation worse on the right. Major airways are patent. The appearance of the lungs and pleura is very similar to that in July 2015, but was resolved on the intervening 06/10/2014 chest CT.  Incidental intravenous gas at the right subclavian and IJ. Cardiomegaly with aortic and coronary artery calcified plaque. No pericardial effusion.  Degenerative changes at both sternoclavicular joints appear stable. Mediastinal lymph nodes are mildly increased and appear reactive in nature, including an 11 mm prevascular node on series 4, image 20.  Diffuse degenerative changes and widespread ankylosis in the spine. Bulky ste lower cervical and thoracic flowing osteophytes. No acute osseous abnormality identified.  CT ABDOMEN AND PELVIS FINDINGS  Lower thoracic and lumbar flowing osteophytes with widespread ankylosis. Superimposed advanced lumbar disc and endplate degeneration. Partial ankylosis of both SI joints. Moderate to severe degenerative changes at both hips with subchondral cysts. No acute osseous abnormality identified.  Widespread calcified atherosclerosis including involvement of the abdominal aorta and its  branches.  Widespread body wall subcutaneous fat stranding.  Foley catheter in place, the bladder is decompressed. Mild presacral stranding. Trace pelvic free fluid (series 4, image 110). The rectum is decompressed and unremarkable.  Redundant sigmoid colon with gas and stool. More decompressed left colon with mild diverticulosis and retained stool. Redundant transverse colon with retained stool. Diverticulosis in the  right colon with retained stool. Normal appendix. The cecum is mostly located in the pelvis. Oral contrast has just reached the terminal ileum. No dilated or abnormal small bowel loops identified. Percutaneous gastrostomy tube. Contrast in the stomach and duodenum with no adverse features.  Negative non contrast liver, gallbladder, spleen, pancreas, and adrenal glands. No hydronephrosis. Diminutive appearance of both kidneys. 12 mm lobulated calcification at the left renal hilum could be in the collecting system or other dystrophic nephrocalcinosis. No hydroureter. No abdominal free fluid identified. No free air. No lymphadenopathy. Ectatic common iliac arteries.  IMPRESSION: 1. Moderate size layering right and loculated left pleural effusions with bilateral pneumonia. The appearance of the chest is similar to that in July 2015. 2. There may be superimposed pulmonary edema, with evidence of some body wall edema diffusely. And volume overload might also explain trace pelvic free fluid. 3. No other acute or inflammatory process identified. 4. Extensive atherosclerosis. Cardiomegaly. Severe diffuse idiopathic skeletal hyperostosis.   Electronically Signed   By: Genevie Ann M.D.   On: 11/26/2014 14:37   Dg Chest Port 1 View  11/26/2014   CLINICAL DATA:  81 year old male with history of lung cancer, congestive heart and hypertension presenting with fever and chills. Initial encounter.  EXAM: PORTABLE CHEST - 1 VIEW  COMPARISON:  10/22/2014.  FINDINGS: Progressive consolidation lung bases greater on the left.  This may represent bibasilar infectious infiltrates superimposed upon post therapy changes of the left lung base.  Pulmonary vascular congestion/ mild pulmonary edema.  No gross pneumothorax.  Cardiomegaly.  Calcified tortuous aorta.  Limited for evaluating for presence of underlying mass. Recommend followup until clearance.  Bilateral shoulder joint degenerative changes.  IMPRESSION: Progressive consolidation lung bases greater on the left. This may represent bibasilar infectious infiltrates superimposed upon post therapy changes of the left lung base.  Pulmonary vascular congestion/ mild pulmonary edema.   Electronically Signed   By: Genia Del M.D.   On: 11/26/2014 09:35    EKG: Sinus tachycardia with left axis deviation and right bundle branch blocks  Weights: Filed Weights   11/30/14 0444 12/01/14 2252 12/02/14 0214  Weight: 201 lb 3.2 oz (91.264 kg) 203 lb (92.08 kg) 204 lb (92.534 kg)     Physical Exam: Blood pressure 142/72, pulse 66, temperature 98.7 F (37.1 C), temperature source Tympanic, resp. rate 20, height '5\' 11"'$  (1.803 m), weight 204 lb (92.534 kg), SpO2 100 %. Body mass index is 28.46 kg/(m^2). General: Well developed, disheveled and weak Head eyes ears nose throat: Normocephalic, atraumatic, sclera non-icteric, no xanthomas, nares are without discharge. No apparent thyromegaly and/or mass  Lungs: Normal respiratory effort.  Positive wheezes, positive rales, positive rhonchi.  Heart: RRR with normal S1 S2. no murmur gallop, no rub, PMI is normal size and placement, carotid upstroke normal without bruit, jugular venous pressure is normal Abdomen: Soft, non-tender, non-distended with normoactive bowel sounds. No hepatomegaly. No rebound/guarding. No obvious abdominal masses. Abdominal aorta is normal size without bruit Extremities: Positive edema. no cyanosis, no clubbing, no ulcers  Peripheral : 2+ bilateral upper extremity pulses, 2+ bilateral femoral pulses, 2+ bilateral  dorsal pedal pulse Neuro: Not oriented Musculoskeletal: Normal muscle tone with2 kyphosis Psych:  Responds to questions appropriately with a normal affect.    Assessment: 81 year old male with known chronic systolic dysfunction congestive heart failure with acute on chronic exacerbation secondary to COPD hypoxia bilateral pneumonia with effusions sleep apnea chronic kidney disease causing elevated troponin consistent with demand ischemia without evidence of myocardial infarction  Plan: 1. Continue Lasix as necessary for any lower extremity edema pulmonary edema and/or pleural effusions and watch closely for worsening chronic kidney disease 2. Continue oxygenation 24 hours for sleep apnea as well as significant hypoxia status post small cell cancer treatment and COPD 3. Consider echocardiogram for further evaluation of extent of LV systolic dysfunction for further evaluation of continuation of carvedilol 4. Continue high intensity cholesterol therapy with statin therapy if able 5. Palliative care to help with management of the above issues due to poor long-term prognosis  Signed, Corey Skains M.D. [facility] 12/02/2014, 12:59 PM

## 2014-12-02 NOTE — Progress Notes (Signed)
ANTIBIOTIC CONSULT NOTE - FOLLOW UP  Pharmacy Consult for Vancomycin and Zosyn Indication: pneumonia  No Known Allergies  Patient Measurements: Height: '5\' 11"'$  (180.3 cm) Weight: 204 lb (92.534 kg) IBW/kg (Calculated) : 75.3 Adjusted Body Weight:   Vital Signs: Temp: 98.7 F (37.1 C) (05/16 0524) BP: 142/72 mmHg (05/16 1255) Pulse Rate: 66 (05/16 1255) Intake/Output from previous day: 05/15 0701 - 05/16 0700 In: 537 [NG/GT:237; IV Piggyback:300] Out: 1155 [Urine:1150; Drains:5] Intake/Output from this shift: Total I/O In: 1160 [Other:200; NG/GT:960] Out: 5 [Drains:5]  Labs:  Recent Labs  11/30/14 0527 12/02/14 0426  WBC 8.3  --   HGB 8.6* 9.1*  PLT 95*  --   CREATININE 1.88* 1.75*   Estimated Creatinine Clearance: 33.3 mL/min (by C-G formula based on Cr of 1.75).  Recent Labs  12/01/14 1752  VANCOTROUGH 21*     Microbiology: Recent Results (from the past 720 hour(s))  Blood Culture (routine x 2)     Status: None   Collection Time: 11/26/14  9:10 AM  Result Value Ref Range Status   Specimen Description BLOOD  Final   Special Requests NONE  Final   Culture NO GROWTH 5 DAYS  Final   Report Status 12/01/2014 FINAL  Final  Blood Culture (routine x 2)     Status: None   Collection Time: 11/26/14  9:12 AM  Result Value Ref Range Status   Specimen Description BLOOD  Final   Special Requests NONE  Final   Culture NO GROWTH 5 DAYS  Final   Report Status 12/01/2014 FINAL  Final  Urine culture     Status: None   Collection Time: 11/26/14  9:28 AM  Result Value Ref Range Status   Specimen Description URINE, CLEAN CATCH  Final   Special Requests NONE  Final   Culture NO GROWTH 2 DAYS  Final   Report Status 11/28/2014 FINAL  Final  C difficile quick scan w PCR reflex [facility])     Status: None   Collection Time: 11/29/14  9:45 AM  Result Value Ref Range Status   C Diff antigen POSITIVE  Final   C Diff toxin NEGATIVE  Final   C Diff interpretation   Final   Negative for toxigenic C. difficile. Toxin gene and active toxin production not detected. May be a nontoxigenic strain of C. difficile bacteria present, lacking the ability to produce toxin.  Clostridium Difficile by PCR     Status: None   Collection Time: 11/29/14  9:45 AM  Result Value Ref Range Status   C difficile by pcr NEGATIVE NEGATIVE Final  Culture, sputum-assessment     Status: None (Preliminary result)   Collection Time: 12/01/14  6:00 PM  Result Value Ref Range Status   Specimen Description SPUTUM  Final   Special Requests NONE  Final   Sputum evaluation   Final    Sputum specimen not acceptable for testing.  Please recollect.   C/CHARLIE FLEETWOOD AT 1904 12/01/14.PMH   Report Status PENDING  Incomplete  Culture, expectorated sputum-assessment     Status: None (Preliminary result)   Collection Time: 12/01/14  8:26 PM  Result Value Ref Range Status   Specimen Description SPUTUM  Final   Special Requests NONE  Final   Sputum evaluation   Final    Sputum specimen not acceptable for testing.  Please recollect.   C/ADRIANNE WHITE AT 2103 12/01/14.PMH   Report Status PENDING  Incomplete    Medical History: Past Medical History  Diagnosis  Date  . CHF (congestive heart failure)   . Dysphagia   . Anemia   . Hypothyroid   . Hypertension   . Enlarged prostate   . Chronic kidney disease   . Arthritis   . Hyperlipidemia   . GERD (gastroesophageal reflux disease)   . Gout   . Lung cancer   . Obstructive sleep apnea     Medications:  Scheduled:  . allopurinol  100 mg Oral BID  . aspirin EC  325 mg Oral Daily  . atorvastatin  10 mg Oral Daily  . carvedilol  6.25 mg Oral BID WC  . clopidogrel  75 mg Oral Daily  . dronabinol  5 mg Oral BID AC  . feeding supplement (JEVITY 1.2 CAL)  237 mL Per Tube Q0600  . feeding supplement (JEVITY 1.2 CAL)  480 mL Per Tube TID  . ferrous sulfate  325 mg Oral BID WC  . finasteride  5 mg Oral Daily  . GELOCAST UNNAS BOOT  2 application  Apply externally Once  . heparin  5,000 Units Subcutaneous 3 times per day  . nitroGLYCERIN  0.4 mg Transdermal Daily  . pantoprazole  40 mg Oral Daily  . piperacillin-tazobactam (ZOSYN)  IV  3.375 g Intravenous 3 times per day  . senna  1 tablet Oral BID  . spironolactone  25 mg Oral Daily  . tamsulosin  0.4 mg Oral Daily  . torsemide  40 mg Oral BID  . vancomycin  1,250 mg Intravenous Once  . [START ON 2014-12-23] vancomycin  1,000 mg Intravenous Q36H   Assessment: Pharmacy consulted to dose vancomycin and zosyn in this 81yo M being treated for possible PNA  Goal of Therapy:  Vancomycin trough level 15-20 mcg/ml  Plan:  Zosyn 3.375gm IV Q8hrs ordered as EI. Vancomycin '1250mg'$  IV once ordered, then vancomycin '1250mg'$  IV Q36hrs ordered to start 12 hours after first dose for stacked dosing. Trough ordered for 5/15 at 1730hrs which will be close to steadystate.   Trough 5/15 = 21. Dose was reduced to vancomycin IV 1g q36h. SCr stable. Will continue current dosing. Measure antibiotic drug levels at steady state Follow up culture results  Skip Mayer, PharmD  12/02/2014

## 2014-12-02 NOTE — Consult Note (Addendum)
Palliative Medicine Inpatient Consult Note   Name: Mathew Morris Date: 12/02/2014 MRN: [id number]  DOB: Nov 28, 1923  Referring Physician: Max Sane, MD  Palliative Care consult requested for this 81 y.o. male for goals of medical therapy in patient with COPD, CM, CKD, pulmonary HTN, CAD, PVD admitted with pneumonia  Mr Mathew Morris is an 81 yo man with PMH of severe pulmonary HTN (RVSP 59mHg by ECHO 09/2013), CM with EF 30-35%, COPD on 2L 02, VDRF (09/2013), CAD (100% RCA not stented), PVD, dysphagia s/p PEG, SCLC s/p lobectomy, OSA, CKD III, AOCD, thyroidectomy with hypothyroidism, HTN, hyperlipidemia, BPH, GERD. Mathew Morris was admitted 11/26/14 with fever and altered mental status. Work-up significant for B.pneumonia. Mathew Morris has not improved significantly since admission. At present, Mathew Morris is sitting in chair. Denies discomfort. Wife at bedside.    REVIEW OF SYSTEMS:  Pain: None Dyspnea:  Yes Nausea/Vomiting:  No Diarrhea:  No Constipation:   No Depression:   No Anxiety:   No Fatigue:   Yes  SPIRITUAL SUPPORT SYSTEM: Yes.  SOCIAL HISTORY:Mathew Morris lives at home with his wife of 460yr Mathew Morris has 5 children from a previous marriage. Mathew [place] daughter lives with Mathew Morris and his wife. However, wife says that this daughter has "problems of her own" and is no help with Mathew Morris's care.   reports that Mathew Morris quit smoking about 31 years ago. Mathew Morris has never used smokeless tobacco. Mathew Morris reports that Mathew Morris does not drink alcohol or use illicit drugs.  LEGAL DOCUMENTS: none   CODE STATUS: Full code  PAST MEDICAL HISTORY: Past Medical History  Diagnosis Date  . CHF (congestive heart failure)   . Dysphagia   . Anemia   . Hypothyroid   . Hypertension   . Enlarged prostate   . Chronic kidney disease   . Arthritis   . Hyperlipidemia   . GERD (gastroesophageal reflux disease)   . Gout   . Lung cancer   . Obstructive sleep apnea     PAST SURGICAL HISTORY:  Past Surgical  History  Procedure Laterality Date  . Cardiac catheterization    . Lobectomy Left   . Thyroidectomy    . Bilateral carpal tunnel release Bilateral     ALLERGIES:  has No Known Allergies.  MEDICATIONS:  Current Facility-Administered Medications  Medication Dose Route Frequency Provider Last Rate Last Dose  . acetaminophen (TYLENOL) tablet 650 mg  650 mg Oral Q6H PRN SoFritzi MandesMD   650 mg at 11/30/14 1051   Or  . acetaminophen (TYLENOL) suppository 650 mg  650 mg Rectal Q6H PRN SoFritzi MandesMD      . allopurinol (ZYLOPRIM) tablet 100 mg  100 mg Oral BID SoFritzi MandesMD   100 mg at 12/02/14 0758  . aspirin EC tablet 325 mg  325 mg Oral Daily SoFritzi MandesMD      . atorvastatin (LIPITOR) tablet 10 mg  10 mg Oral Daily SoFritzi MandesMD   10 mg at 12/02/14 0758  . carboxymethylcellulose (REFRESH PLUS) 0.5 % ophthalmic solution 1 drop  1 drop Both Eyes BID PRN SoFritzi MandesMD      . carvedilol (COREG) tablet 6.25 mg  6.25 mg Oral BID WC SoFritzi MandesMD   6.25 mg at 12/02/14 0758  . clopidogrel (PLAVIX) tablet 75 mg  75 mg Oral Daily SoFritzi MandesMD   75 mg at 12/02/14 0758  . dronabinol (MARINOL) capsule  5 mg  5 mg Oral BID AC Fritzi Mandes, MD   5 mg at 12/02/14 0758  . feeding supplement (JEVITY 1.2 CAL) liquid 237 mL  237 mL Per Tube Q0600 Fritzi Mandes, MD   237 mL at 12/02/14 0018  . feeding supplement (JEVITY 1.2 CAL) liquid 480 mL  480 mL Per Tube TID Fritzi Mandes, MD   480 mL at 12/02/14 0800  . ferrous sulfate tablet 325 mg  325 mg Oral BID WC Fritzi Mandes, MD   325 mg at 12/01/14 1822  . finasteride (PROSCAR) tablet 5 mg  5 mg Oral Daily Fritzi Mandes, MD   5 mg at 12/02/14 0758  . GELOCAST UNNAS BOOT MISC 2 application  2 application Apply externally Once Theodoro Grist, MD      . heparin injection 5,000 Units  5,000 Units Subcutaneous 3 times per day Fritzi Mandes, MD   5,000 Units at 12/02/14 0538  . nitroGLYCERIN (NITRODUR - Dosed in mg/24 hr) patch 0.4 mg  0.4 mg Transdermal Daily Fritzi Mandes, MD   0.4  mg at 12/02/14 1123  . ondansetron (ZOFRAN) tablet 4 mg  4 mg Oral Q6H PRN Fritzi Mandes, MD       Or  . ondansetron (ZOFRAN) injection 4 mg  4 mg Intravenous Q6H PRN Fritzi Mandes, MD      . pantoprazole (PROTONIX) EC tablet 40 mg  40 mg Oral Daily Fritzi Mandes, MD   40 mg at 12/01/14 1132  . piperacillin-tazobactam (ZOSYN) IVPB 3.375 g  3.375 g Intravenous 3 times per day Fritzi Mandes, MD   3.375 g at 12/02/14 0757  . senna (SENOKOT) tablet 8.6 mg  1 tablet Oral BID Fritzi Mandes, MD   8.6 mg at 12/01/14 1132  . spironolactone (ALDACTONE) tablet 25 mg  25 mg Oral Daily Fritzi Mandes, MD   25 mg at 12/02/14 0758  . tamsulosin (FLOMAX) capsule 0.4 mg  0.4 mg Oral Daily Fritzi Mandes, MD   0.4 mg at 12/02/14 0758  . torsemide (DEMADEX) tablet 40 mg  40 mg Oral BID Theodoro Grist, MD   40 mg at 12/02/14 0758  . vancomycin (VANCOCIN) 1,250 mg in sodium chloride 0.9 % 250 mL IVPB  1,250 mg Intravenous Once Fritzi Mandes, MD   1,250 mg at 11/26/14 1753  . [START ON 12/11/14] vancomycin (VANCOCIN) IVPB 1000 mg/200 mL premix  1,000 mg Intravenous Q36H Vira Blanco, RPH      . zolpidem (AMBIEN) tablet 5 mg  5 mg Oral QHS PRN Nicholes Mango, MD        Vital Signs: BP 143/79 mmHg  Pulse 74  Temp(Src) 98.7 F (37.1 C) (Tympanic)  Resp 18  Ht _0  (1.803 m)  Wt 92.534 kg (204 lb)  BMI 28.46 kg/m2  SpO2 100% Filed Weights   11/30/14 0444 12/01/14 2252 12/02/14 0214  Weight: 91.264 kg (201 lb 3.2 oz) 92.08 kg (203 lb) 92.534 kg (204 lb)    Estimated body mass index is 28.46 kg/(m^2) as calculated from the following:   Height as of this encounter: _1  (1.803 m).   Weight as of this encounter: 92.534 kg (204 lb).  PERFORMANCE STATUS (ECOG) : 3 - Symptomatic, >50% confined to bed  PHYSICAL EXAM:  Generall: ill-appearing HEENT: OP clear, edentulous, hoarse, hearing intact Neck: Trachea midline, no LAN Cardiovascular: regular rate and rhythm  Pulmonary/Chest: poor air movement, no audible wheeze Abdominal: Soft,  NTTP, hypoactive bowel sounds GU: No SP tenderness, No CVA  tenderness Extremities: LE's wrapped from ankles to knees Neurological: global weakness but grossly nonfocal Skin: LE's wrapped, not examined Psychiatric: oriented to place, year, wife  LABS: CBC:  Recent Labs Lab 11/29/14 0437 11/30/14 0527 12/02/14 0426  WBC 8.7 8.3  --   HGB 8.4* 8.6* 9.1*  HCT 26.3* 26.2*  --   PLT 92* 95*  --    Comprehensive Metabolic Panel:  Recent Labs Lab 11/26/14 0910  11/30/14 0527 12/02/14 0426  NA 140  < > 142 145  K 5.4*  < > 4.6 5.4*  CL 98*  < > 103 106  CO2 31  < > 30 33*  GLUCOSE 90  < > 103* 129*  BUN 77*  < > 67* 68*  CREATININE 1.93*  < > 1.88* 1.75*  CALCIUM 9.0  < > 8.4* 8.8*  AST 48*  --   --   --   ALT 58  --   --   --   ALKPHOS 143*  --   --   --   BILITOT 1.1  --   --   --   < > = values in this interval not displayed.  IMPRESSION:  Mr Kidd is an 81 yo man with PMH of severe pulmonary HTN (RVSP 76mHg by ECHO 09/2013), CM with EF 30-35%, COPD on 2L 02, VDRF (09/2013), CAD (100% RCA not stented), PVD, dysphagia s/p PEG, SCLC s/p lobectomy, OSA, CKD III, AOCD, thyroidectomy with hypothyroidism, HTN, hyperlipidemia, BPH, GERD. Mathew Morris was admitted 11/26/14 with fever and altered mental status. Work-up significant for B.pneumonia. Mathew Morris has not improved significantly since admission.   I met with Mathew Morris's wife and then with Mathew Morris in the presence of his wife. Wife does not feel that she can continue to care for Mathew Morris at home and Mathew Morris understands this. We discussed the options of SNF for STR vs home with hospice vs Hospice Home. Mathew Morris would like to discuss further with his wife. I will follow up.   I spoke with Mathew Morris about code status. Again, Mathew Morris wants to discuss with his wife.    PLAN: As above  More than 50% of the visit was spent in counseling/coordination of care: YES  Time spent: 80 minutes

## 2014-12-02 NOTE — Progress Notes (Signed)
Talked to patient and wife about results with ABG and explained that it shows he is not breathing as well as he should and that I talked to Dr. Ether Griffins with results and she wants him on bipap at this time. Patient and wife understand and know that this is different from his cpap machine. After a lengthy discussion with family he stated he does not want this at this time. Dr Phifer was notified just after discussion with wife and patient. Patient remains on 4lpm [place] and is comfortable with it. RN made aware as well.

## 2014-12-02 NOTE — Progress Notes (Signed)
Physical Therapy Treatment Patient Details Name: Mathew Morris MRN: [id number] DOB: [birth year] Today's Date: 12/02/2014    History of Present Illness 81 yo male with onset of PNA B Lungs, cardiomegaly, pulm edema, severe atherosclerosis is referred to PT.  PMHx: Lung CA, CKD 3, sepsis with hypoxic respiratory failure.    PT Comments    Pt was seen for attempted gait but did only some standing partially to readjust position and sit more upright.  Pt was able to do there ex only assisted but did elevate his legs to assist with edema management.    Follow Up Recommendations  SNF     Equipment Recommendations  None recommended by PT    Recommendations for Other Services       Precautions / Restrictions Precautions Precautions: Fall Restrictions Weight Bearing Restrictions: No Other Position/Activity Restrictions: PEG    Mobility  Bed Mobility               General bed mobility comments: up when PT entered  Transfers       Sit to Stand: Min assist;Mod assist (scooted back in chair to position better)         General transfer comment: sleepy declined  Ambulation/Gait             General Gait Details: unable to stand   Stairs            Wheelchair Mobility    Modified Rankin (Stroke Patients Only)       Balance Overall balance assessment: Needs assistance Sitting-balance support: Feet supported Sitting balance-Leahy Scale: Poor Sitting balance - Comments: needs back support                            Cognition Arousal/Alertness: Lethargic Behavior During Therapy: Flat affect Overall Cognitive Status: Impaired/Different from baseline Area of Impairment: Awareness;Following commands       Following Commands: Follows one step commands inconsistently   Awareness: Intellectual   General Comments: sleepy and not very mobile on cue    Exercises General Exercises - Lower Extremity Ankle Circles/Pumps: AROM;AAROM;Both;10  reps Quad Sets: AROM;Both;5 reps Hip ABduction/ADduction: AAROM;Both;10 reps Hip Flexion/Marching: AAROM;Both;5 reps    General Comments General comments (skin integrity, edema, etc.): lethargy since yesterday per wife, has just changed off telemetry floor but sleepy and unable to stand completely.        Pertinent Vitals/Pain Pain Assessment: No/denies pain    Home Living                      Prior Function            PT Goals (current goals can now be found in the care plan section) Acute Rehab PT Goals Patient Stated Goal: unable to state Progress towards PT goals: PT to reassess next treatment    Frequency  Min 2X/week    PT Plan Current plan remains appropriate    Co-evaluation             End of Session Equipment Utilized During Treatment: Oxygen Activity Tolerance: Patient limited by fatigue;Patient limited by lethargy Patient left: in chair;with call bell/phone within reach;with chair alarm set;with family/visitor present     Time: 1751-0258 PT Time Calculation (min) (ACUTE ONLY): 28 min  Charges:  $Therapeutic Exercise: 8-22 mins $Therapeutic Activity: 8-22 mins                    G Codes:  Ramond Dial 12/02/2014, 10:51 AM   Mee Hives, PT MS Acute Rehab Dept. Number: [phone] [phone] and [place] [phone]

## 2014-12-02 NOTE — Plan of Care (Signed)
Problem: Discharge Progression Outcomes Goal: Discharge plan in place and appropriate Individualization of Care Pt likes to be called Mr Moscato Lives at home with spouse Pt has a hx of dysphagia- on bolus TF, COPD, CKD, anemia, small lung ca, hypertension and sleep apnea (pt utilizes own Cpap). Pt co-morbidities are currently medically managed by home medications.  Goal: Other Discharge Outcomes/Goals Plan of Care Progress to Goal:   pt still on iv antibiotics, O2 at 4 liters. Poor mobility, needs two people to assist with transfers.   O2 sats: currently patient on O2 at 4 liters. Will attempt to wean. Pain control: patient denied pain this shift. Tolerating diet: Pt had one can of Jevity per bolus since being transferred from 2A. He tolerated the bolus well with only 70ms of residual.

## 2014-12-02 NOTE — Progress Notes (Signed)
[facility] [place] NAME: Mathew Morris    MR#:  [id number]  DATE OF BIRTH:  January 09, 1924  SUBJECTIVE:  CHIEF COMPLAINT:   Chief Complaint  Patient presents with  . Code Sepsis   patient is very somnolent, not able to provide  review of systems. NO SOB, although now on 4 liters O2 per [place], breathing via opened moputh Review of Systems  Unable to perform ROS: medical condition     VITAL SIGNS: Blood pressure 143/79, pulse 74, temperature 98.7 F (37.1 C), temperature source Tympanic, resp. rate 18, height '5\' 11"'$  (1.803 m), weight 92.534 kg (204 lb), SpO2 100 %.  PHYSICAL EXAMINATION:   GENERAL:  81 y.o.-year-old patient sitting in chair, somnolent, could barely wake him up,  Converses minnimally and is very slow in his responses and his voice is low. EYES: Pupils equal, round, reactive to light and accommodation. No scleral icterus. Extraocular muscles intact.  HEENT: Head atraumatic, normocephalic. Oropharynx and nasopharynx clear.  NECK:  Supple, no jugular venous distention. No thyroid enlargement, no tenderness.  LUNGS: Diminished breath sounds bilaterally, especially in R posterior lobe, no wheezing,  Has rales,  No crepitations. No use of accessory muscles of respiration.  CARDIOVASCULAR: S1, S2 normal. No murmurs, rubs, or gallops.  ABDOMEN: Soft, nontender, nondistended. Bowel sounds present. No organomegaly or mass. Feeding tube in place  EXTREMITIES: some pedal edema, no cyanosis, or clubbing. Erythema in feet is resolving , has some between toe ulcerations, fissures. Unna boots are placed, evaluation is obscured NEUROLOGIC: Cranial nerves II through XII are intact. Muscle strength 3/5in all extremities. Sensation intact. Gait not checked.  PSYCHIATRIC: The patient is somnolent, and oriented x 3.  SKIN: erythema in LE, mostly feet and between toes  ORDERS/RESULTS REVIEWED:   CBC  Recent Labs Lab 11/26/14 0910  11/26/14 1635 11/27/14 0419 11/28/14 0444 11/29/14 0437 11/30/14 0527 12/02/14 0426  WBC 17.3* 16.2* 10.5 9.3 8.7 8.3  --   HGB 9.1* 8.9* 8.5* 8.7* 8.4* 8.6* 9.1*  HCT 28.5* 27.8* 26.3* 26.7* 26.3* 26.2*  --   PLT 117* 102* 86* 91* 92* 95*  --   MCV 94.9 95.8 96.4 96.3 96.5 96.9  --   MCH 30.3 30.8 31.0 31.2 30.9 31.6  --   MCHC 31.9* 32.1 32.2 32.4 32.1 32.6  --   RDW 20.2* 20.3* 20.1* 20.6* 20.2* 20.9*  --   LYMPHSABS 0.4*  --   --   --   --   --   --   MONOABS 0.5  --   --   --   --   --   --   EOSABS 0.0  --   --   --   --   --   --   BASOSABS 0.0  --   --   --   --   --   --    ------------------------------------------------------------------------------------------------------------------  Chemistries   Recent Labs Lab 11/26/14 0910  11/27/14 0419 11/28/14 0444 11/29/14 0437 11/30/14 0527 12/02/14 0426  NA 140  --   --  141  --  142 145  K 5.4*  --   --  4.7  --  4.6 5.4*  CL 98*  --   --  101  --  103 106  CO2 31  --   --  31  --  30 33*  GLUCOSE 90  --   --  106*  --  103* 129*  BUN 77*  --   --  77*  --  67* 68*  CREATININE 1.93*  < > 1.87* 1.95* 2.02* 1.88* 1.75*  CALCIUM 9.0  --   --  8.6*  --  8.4* 8.8*  AST 48*  --   --   --   --   --   --   ALT 58  --   --   --   --   --   --   ALKPHOS 143*  --   --   --   --   --   --   BILITOT 1.1  --   --   --   --   --   --   < > = values in this interval not displayed. ------------------------------------------------------------------------------------------------------------------ estimated creatinine clearance is 33.3 mL/min (by C-G formula based on Cr of 1.75). ------------------------------------------------------------------------------------------------------------------ No results for input(s): TSH, T4TOTAL, T3FREE, THYROIDAB in the last 72 hours.  Invalid input(s): FREET3  Cardiac Enzymes  Recent Labs Lab 11/26/14 0910  TROPONINI 0.85*    ------------------------------------------------------------------------------------------------------------------ Invalid input(s): POCBNP ---------------------------------------------------------------------------------------------------------------  RADIOLOGY: Dg Chest 2 View  11/30/2014   CLINICAL DATA:  COPD, CHF, history of lung cancer.  EXAM: CHEST  2 VIEW  COMPARISON:  11/26/2014 and 10/22/2014 as well as chest CT 11/26/2014  FINDINGS: Lungs are adequately inflated with stable opacification the left base likely moderate size effusion with atelectasis as there is less of a loculated component laterally. Worsening right base opacification likely worsening small to moderate right pleural effusion with associated atelectasis. Cannot exclude infection in the lung bases. Cardiomediastinal silhouette and remainder the exam is unchanged.  IMPRESSION: Stable left base opacification compatible with moderate effusion with atelectasis. Less loculation laterally. Worsening small to moderate right pleural effusion likely with associated atelectasis. Cannot exclude infection in the lung bases.   Electronically Signed   By: Marin Olp M.D.   On: 11/30/2014 14:52    EKG:  Orders placed or performed during the hospital encounter of 11/26/14  . EKG 12-Lead  . EKG 12-Lead  . EKG 12-Lead  . EKG 12-Lead    ASSESSMENT AND PLAN: 81 year old Mathew Morris with past medical history of CAD stage III-4, small cell lung cancer with resection, history of dysphagia status post G-tube placement in April 2016 will recently was started on Bactrim due to skin infection around the G-tube site comes in with increasing shortness of breath fever of 103.6 at home. Patient is being admitted with   #1 acute hypoxic and likely hypercapnic respiratory failure secondary to combination of bilateral pleural effusion with suspected right lower lobe pneumonia. This was confirmed with CT chest although readings shows patient's  x-ray looks similar to that of July 2015. Continue IV IV Vanco and Zosyn , patient's critical situation was improving, but now on 4 liters per [place], was on 2 liters yesterday. Blood cultures, urine cx NTD,  sputum cultures not accepted. Marland Kitchen Appreciate Pulmonology consult . Now on advanced diuretic , watching patient's in's and outs, now 600 ml negative, but overall positive since admission,  following his oxygenation and shortness of breath. Getting ABG today as I am concerned about CO2 retention, getting palliative care involved to discuss with wife hospice care, pulmonary consult is appreciated  #2 sepsis secondary to pneumonia. Patient presented with fever of 103.6 elevated white count of 17,000 and abnormal chest x-ray. Treatment as above. Sputum cultures are not reported. White blood cell count has normalized.   #3 elevated troponin suspected due to  demand ischemia. Patient does have history of coronary artery disease. cardiac enzyme 3 done, but significant increase was not noted, no further interventions recommended.  #4 bilateral upper and lower extremity rash. Suspected cellulitis vs allergic reaction to bactrim. Significant improvement of his lower extremity rash. Will continue Zosyn and a consult was placed to infectious disease  M.D. , but unlikely is necessary now as it is better  #5 elevated D dimers- DIC panel reveals a significantly elevated D dimers which are greater than 4000. This can be from the underlying sepsis , d-dimer can be an acute phase reactant in this case . I cannot order CT angiogram of the chest to rule out pulmonary embolism as his renal function is abnormal. VQ scan would be indeterminate in view of his pneumonia and pleural effusions. However as the patient's d-dimer is significantly high at greater than 4000, appreciate  opinion from pulmonology. Discussed with Dr. Mortimer Fries, recommended no w/u for PE , suggested palliative care, wife is now willing to d/w palliative care  #6  chronic dysphagia with G-tube feeding , being continued on Jevity. S/p speech therapy evaluation- ok with pleasure eating - nectar thick liquids and puree diet prn only, pulmonary however si against  PO feeds unless in hospice care.   #7. CK D stage III , off IV fluids now, advancing diuretics, not IVF at present, as patient has fluid overloaded  in lungs, however has improved today. Check BMP in a.m. Now on Torsemide 3 times a day, chest xray recently showed worsening pleural effusions.  #8 DVT prophylaxis subcutaneous heparin 3 times a day, while closely monitoring of his platelet count as he has thrombocytopenia. Today his platelet count is at 95 now, stable  #9 discharge planning with care Management and social worker, likely rehab vs hospice care  10 OSA, OK to use CPAP machine from home   11 . Bilateral pleural effusions, likely  heart failure related. Also cannot rule out CK D related to fluid retention. We will be initiating diuretics at the higher dose, watching patient's in's and outs as well as his kidney function.  Management plans discussed with the patient, family and they are in agreement.   DRUG ALLERGIES: No Known Allergies  CODE STATUS:     Code Status Orders        Start     Ordered   11/26/14 1459  Full code   Continuous     11/26/14 1501    Advance Directive Documentation        Most Recent Value   Type of Advance Directive  Healthcare Power of Attorney   Pre-existing out of facility DNR order (yellow form or pink MOST form)     "MOST" Form in Place?        TOTAL TIME TAKING CARE OF THIS PATIENT: 45 minutes.   Prolonged discussion with pt's wife about need for palliative care consult, Campbell Stall M.D on 12/02/2014 at 10:49 AM  Between 7am to 6pm - Pager - [phone]  After 6pm go to [email or web address] - password [email or web address] [facility]  [facility]  Office  [phone]  CC: Primary care physician; Maryland Pink, MD

## 2014-12-03 DIAGNOSIS — N184 Chronic kidney disease, stage 4 (severe): Secondary | ICD-10-CM

## 2014-12-03 DIAGNOSIS — R21 Rash and other nonspecific skin eruption: Secondary | ICD-10-CM

## 2014-12-03 DIAGNOSIS — I5033 Acute on chronic diastolic (congestive) heart failure: Secondary | ICD-10-CM

## 2014-12-03 DIAGNOSIS — R778 Other specified abnormalities of plasma proteins: Secondary | ICD-10-CM

## 2014-12-03 DIAGNOSIS — J90 Pleural effusion, not elsewhere classified: Secondary | ICD-10-CM

## 2014-12-03 DIAGNOSIS — J9620 Acute and chronic respiratory failure, unspecified whether with hypoxia or hypercapnia: Secondary | ICD-10-CM

## 2014-12-03 DIAGNOSIS — R7989 Other specified abnormal findings of blood chemistry: Secondary | ICD-10-CM

## 2014-12-03 DIAGNOSIS — G4733 Obstructive sleep apnea (adult) (pediatric): Secondary | ICD-10-CM

## 2014-12-03 LAB — BASIC METABOLIC PANEL
Anion gap: 7 (ref 5–15)
BUN: 71 mg/dL — ABNORMAL HIGH (ref 6–20)
CO2: 33 mmol/L — ABNORMAL HIGH (ref 22–32)
Calcium: 8.8 mg/dL — ABNORMAL LOW (ref 8.9–10.3)
Chloride: 105 mmol/L (ref 101–111)
Creatinine, Ser: 1.87 mg/dL — ABNORMAL HIGH (ref 0.61–1.24)
GFR, EST AFRICAN AMERICAN: 35 mL/min — AB (ref >60)
GFR, EST NON AFRICAN AMERICAN: 30 mL/min — AB (ref >60)
GLUCOSE: 102 mg/dL — AB (ref 65–99)
POTASSIUM: 5.5 mmol/L — AB (ref 3.5–5.1)
SODIUM: 145 mmol/L (ref 135–145)

## 2014-12-03 LAB — POTASSIUM: POTASSIUM: 5.2 mmol/L — AB (ref 3.5–5.1)

## 2014-12-03 LAB — GLUCOSE, CAPILLARY
GLUCOSE-CAPILLARY: 98 mg/dL (ref 65–99)
Glucose-Capillary: 131 mg/dL — ABNORMAL HIGH (ref 65–99)
Glucose-Capillary: 102 mg/dL — ABNORMAL HIGH (ref 65–99)

## 2014-12-03 MED ORDER — MORPHINE SULFATE 2 MG/ML IJ SOLN
2.0000 mg | INTRAMUSCULAR | Status: DC | PRN
  Administered 2014-12-03: 14:00:00 2 mg via INTRAVENOUS
  Filled 2014-12-03: qty 1

## 2014-12-03 MED ORDER — ONDANSETRON HCL 4 MG PO TABS: 4.0000 mg | ORAL_TABLET | Freq: Four times a day (QID) | ORAL | Status: AC | PRN

## 2014-12-03 MED ORDER — MORPHINE SULFATE (CONCENTRATE) 10 MG/0.5ML PO SOLN: 10.0000 mg | ORAL | Status: DC | PRN

## 2014-12-03 MED ORDER — SODIUM POLYSTYRENE SULFONATE 15 GM/60ML PO SUSP
45.0000 g | Freq: Once | ORAL | Status: AC
  Administered 2014-12-03: 07:00:00 45 g via RECTAL
  Filled 2014-12-03: qty 180

## 2014-12-03 MED ORDER — LORAZEPAM 1 MG PO TABS: 1.0000 mg | ORAL_TABLET | ORAL | Status: AC | PRN

## 2014-12-03 MED ORDER — ACETAMINOPHEN 650 MG RE SUPP: 650.0000 mg | Freq: Four times a day (QID) | RECTAL | Status: AC | PRN

## 2014-12-03 MED ORDER — LORAZEPAM 2 MG/ML IJ SOLN: 0.5000 mg | INTRAMUSCULAR | Status: AC | PRN

## 2014-12-03 MED ORDER — MORPHINE SULFATE (CONCENTRATE) 10 MG/0.5ML PO SOLN: 10.0000 mg | ORAL | Status: AC | PRN

## 2014-12-03 MED ORDER — LORAZEPAM 2 MG/ML IJ SOLN
0.5000 mg | INTRAMUSCULAR | Status: DC | PRN
  Administered 2014-12-03: 12:00:00 1 mg via INTRAVENOUS
  Filled 2014-12-03: qty 1

## 2014-12-10 ENCOUNTER — Ambulatory Visit: Payer: Medicare Other

## 2014-12-18 NOTE — Discharge Instructions (Signed)
Acute Respiratory Distress Syndrome  Acute respiratory distress syndrome (ARDS) is a serious, life-threatening lung condition that can cause breathing failure. It occurs in people who are critically ill or in people who have had a serious injury.  CAUSES  ARDS occurs when small blood vessels in the lungs leak fluid into the air sacs (alveoli) of the lungs. The fluid causes the lungs to become "stiff" and decreases their ability to inflate. The fluid also prevents oxygen from being absorbed into the bloodstream. When the bloodstream does not have enough oxygen, the body's vital organs do not get enough oxygen to function properly. ARDS can occur in the following conditions:  Sepsis. This is a serious bloodstream infection.  Serious injury (trauma) to the head or chest.  Pneumonia.  After major surgery, such as a lung transplant.  Drug overdose.  Breathing (inhalation) of harmful chemicals. SYMPTOMS  ARDS comes on quickly (rapid onset) and can occur within 24 to 48 hours of an infection, illness, surgery, or injury. Symptoms include:  Shortness of breath or difficulty breathing.  Cyanosis. This is a bluish color to the skin or nail beds (due to low oxygen levels in the blood).  Fast or irregular heart rate.  Low blood pressure (hypotension).  Organ failure. DIAGNOSIS  There is not a specific test to diagnose ARDS. It is usually diagnosed when other diseases and conditions that cause similar symptoms have been ruled out. When a person is thought to have ARDS, the following tests may be performed:  A chest X-ray or computed tomography (CT) scan to look at the lungs.  Arterial blood gas (ABG) analysis. This test looks at the oxygen level in the blood.  Blood tests to rule out infection.  Sputum culture to rule out a lung infection.  Bronchoscopy. TREATMENT  ARDS is a critical condition. People who develop ARDS need to be in a hospital intensive care unit (ICU). Treatment of ARDS  includes:  Providing oxygenation. This is a main treatment goal of ARDS. A breathing machine (ventilator) is often used to help a person breathe and to provide oxygen. When on a breathing machine, medicine is given to keep patients asleep (sedated).  Treatment of the underlying cause of ARDS (infection, illness, or trauma).  Supportive treatment such as:  Intravenous (IV) fluids.  Liquid nutrition that goes through an IV or feeding tube.  Blood pressure medicine to support low blood pressure.  Antibiotic medicine to help fight infection.  Steroid medicine to help decrease swelling (inflammation) in the lungs.  Diuretic medicine to get rid of extra fluid in the body. HOME CARE INSTRUCTIONS  After recovering from ARDS, you may have weakness, shortness of breath, or memory problems. You may also suffer from depression or from complications of the illness that caused ARDS. You can do several things to help your recovery:  Do not smoke.  If you drink alcohol, limit the amount of alcohol you drink to 1 or 2 drinks a day.  Be sure you get a yearly flu (influenza) shot. You should get a pneumonia vaccine once every 5 years.  Ask your caregiver about lung rehabilitation programs.  Ask your caregiver about local support groups for people with breathing problems.  Ask friends and family to help you if daily activities make you tired. SEEK MEDICAL CARE IF:   You become short of breath with activity or while at rest.  You develop a cough that does not go away. SEEK IMMEDIATE MEDICAL CARE IF:   You have sudden  shortness of breath with or without chest pain.  You have chest pain that does not go away.  You develop swelling or pain in one of your legs.  You have trauma to your chest or any other part of your body.  You have a fever.  You overdose or have a reaction to your medicine. MAKE SURE YOU:   Understand these instructions.  Will watch your condition.  Will get help  right away if you are not doing well or get worse. Document Released: 07/05/2005 Document Revised: 11/19/2013 Document Reviewed: 03/24/2011 [facility] Patient Information 2015 [place], [place]. This information is not intended to replace advice given to you by your health care provider. Make sure you discuss any questions you have with your health care provider.

## 2014-12-18 NOTE — Discharge Summary (Signed)
[place] [facility] [place] NAME: Mathew Morris    MR#:  [id number]  DATE OF BIRTH:  1924-05-31  DATE OF ADMISSION:  11/26/2014 ADMITTING PHYSICIAN: Fritzi Mandes, MD  DATE OF DISCHARGE: 12-20-14.  PRIMARY CARE PHYSICIAN: Maryland Pink, MD     ADMISSION DIAGNOSIS:  Community acquired pneumonia [J18.9] Sepsis, due to unspecified organism [A41.9]  DISCHARGE DIAGNOSIS:  Principal Problem:   Acute on chronic diastolic CHF (congestive heart failure), NYHA class 1 Active Problems:   Sepsis   Acute on chronic respiratory failure   Elevated troponin   Rash and nonspecific skin eruption   Chronic kidney disease (CKD), stage IV (severe)   OSA (obstructive sleep apnea)   Pleural effusion   SECONDARY DIAGNOSIS:   Past Medical History  Diagnosis Date  . CHF (congestive heart failure)   . Dysphagia   . Anemia   . Hypothyroid   . Hypertension   . Enlarged prostate   . Chronic kidney disease   . Arthritis   . Hyperlipidemia   . GERD (gastroesophageal reflux disease)   . Gout   . Lung cancer   . Obstructive sleep apnea     .pro HOSPITAL COURSE:   81 year old Mathew Morris with past medical history of CAD stage III-4, small cell lung cancer with resection, history of dysphagia status post G-tube placement in April 2016 will recently was started on Bactrim due to skin infection around the G-tube site comes in with increasing shortness of breath fever of 103.6 at home. Patient is being admitted with   #1 acute hypoxic and likely hypercapnic respiratory failure secondary to combination of bilateral pleural effusion with suspected right lower lobe pneumonia. This was confirmed with CT chest although readings shows patient's x-ray looks similar to that of July 2015. Was on  IV Vanco and Zosyn , but remains critical as requires BIPAP still despite therapy.  Blood cultures, urine cx NTD, sputum cultures not accepted. Marland Kitchen Appreciate Pulmonology,  palliative care  consults. Despite advanced diuretic patient's in's and outs still  positive, also positive since admission OF ABOUT 4.3 LITERS, CONTINUE o2  as needed, following his oxygenation and shortness of breath. Palliative care d/w family, decided on hospice home placement , dc today  #2 sepsis secondary to pneumonia. Patient presented with fever of 103.6 elevated white count of 17,000 and abnormal chest x-ray. Treatment as above. Sputum cultures are not reported. White blood cell count has normalized.   #3 elevated troponin suspected due to demand ischemia. Patient does have history of coronary artery disease. cardiac enzyme 3 done, but significant increase was not noted, no further interventions recommended.  #4 bilateral upper and lower extremity rash. Suspected cellulitis vs allergic reaction to bactrim. Significant improvement of his lower extremity rash. Resolved now  #5 elevated D dimers- DIC panel reveals a significantly elevated D dimers which are greater than 4000. This can be from the underlying sepsis , d-dimer can be an acute phase reactant in this case . We could not order CT angiogram of the chest to rule out pulmonary embolism as his renal function is abnormal. VQ scan would be indeterminate in view of his pneumonia and pleural effusions. However as the patient's d-dimer was significantly higher than normal,  Pulmonology evaluated, Dr. Mortimer Fries recommended no w/u for PE , but palliative care, hospice home placement is initiated, dc  today  #6 chronic dysphagia with G-tube feeding , being continued on Jevity. S/p speech therapy evaluation- ok with pleasure  eating - nectar thick liquids and puree diet prn only, pulmonary however is against any PO feeds unless in hospice care.   #7. CK D stage III , off IVF,  as patient has fluid overload. Continue Torsemide , chest xray recently showed worsening pleural effusions.  #8 DVT prophylaxis , was on subcutaneous heparin 3 times a day,  now dc   #9 discharge planning with care Management and social worker,  hospice care placement today  10 OSA, OK to use CPAP machine from home  11 . Bilateral pleural effusions, likely heart failure related. Also cannot rule out CK D related to fluid retention. We will be initiating diuretics at the higher dose, watching patient's in's and outs as well as his kidney function.   DISCHARGE CONDITIONS:   poor  CONSULTS OBTAINED:  Treatment Team:  Flora Lipps, MD Yolonda Kida, MD Max Sane, MD Corey Skains, MD  DRUG ALLERGIES:  No Known Allergies  DISCHARGE MEDICATIONS:   Current Discharge Medication List    START taking these medications   Details  acetaminophen (TYLENOL) 650 MG suppository Place 1 suppository (650 mg total) rectally every 6 (six) hours as needed for mild pain (or Fever >/= 101). Qty: 12 suppository, Refills: 0    LORazepam (ATIVAN) 1 MG tablet Take 1 tablet (1 mg total) by mouth every hour as needed for anxiety. Qty: 30 tablet, Refills: 0    ondansetron (ZOFRAN) 4 MG tablet Take 1 tablet (4 mg total) by mouth every 6 (six) hours as needed for nausea. Qty: 20 tablet, Refills: 0      CONTINUE these medications which have NOT CHANGED   Details  allopurinol (ZYLOPRIM) 100 MG tablet Take 100 mg by mouth 2 (two) times daily.    carboxymethylcellulose (REFRESH PLUS) 0.5 % SOLN Place 1 drop into both eyes 2 (two) times daily as needed.    finasteride (PROSCAR) 5 MG tablet Take 5 mg by mouth daily.    nitroGLYCERIN (NITROSTAT) 0.4 MG SL tablet Place 0.4 mg under the tongue every 5 (five) minutes as needed for chest pain.    tamsulosin (FLOMAX) 0.4 MG CAPS capsule Take 0.4 mg by mouth daily.    torsemide (DEMADEX) 20 MG tablet Take 20 mg by mouth daily as needed (for edema or weight gain).      STOP taking these medications     aspirin 325 MG tablet      atorvastatin (LIPITOR) 10 MG tablet      carvedilol (COREG) 6.25 MG tablet       clopidogrel (PLAVIX) 75 MG tablet      ferrous sulfate 325 (65 FE) MG tablet      nitroGLYCERIN (NITRODUR - DOSED IN MG/24 HR) 0.4 mg/hr patch      potassium chloride (KLOR-CON) 20 MEQ packet      spironolactone (ALDACTONE) 25 MG tablet      sulfamethoxazole-trimethoprim (BACTRIM DS,SEPTRA DS) 800-160 MG per tablet      dexlansoprazole (DEXILANT) 60 MG capsule      dronabinol (MARINOL) 5 MG capsule      HYDROcodone-acetaminophen (NORCO/VICODIN) 5-325 MG per tablet      isosorbide mononitrate (IMDUR) 120 MG 24 hr tablet      ramipril (ALTACE) 2.5 MG capsule          DISCHARGE INSTRUCTIONS:    Follow up with cardiology, PCP in about 1 week after dc if able  If you experience worsening of your admission symptoms, develop shortness of breath, life threatening  emergency, suicidal or homicidal thoughts you must seek medical attention immediately by calling 911 or calling your MD immediately  if symptoms less severe.  You Must read complete instructions/literature along with all the possible adverse reactions/side effects for all the Medicines you take and that have been prescribed to you. Take any new Medicines after you have completely understood and accept all the possible adverse reactions/side effects.   Please note  You were cared for by a hospitalist during your hospital stay. If you have any questions about your discharge medications or the care you received while you were in the hospital after you are discharged, you can call the unit and asked to speak with the hospitalist on call if the hospitalist that took care of you is not available. Once you are discharged, your primary care physician will handle any further medical issues. Please note that NO REFILLS for any discharge medications will be authorized once you are discharged, as it is imperative that you return to your primary care physician (or establish a relationship with a primary care physician if you do not have one)  for your aftercare needs so that they can reassess your need for medications and monitor your lab values.    Today   CHIEF COMPLAINT:   Chief Complaint  Patient presents with  . Code Sepsis    HISTORY OF PRESENT ILLNESS:  Mathew Morris  is a 81 y.o. male with a known history of CHF, dysphagia presents with fevers, SOB, noted to have pneumonia, respiratory failure  #1 acute hypoxic and likely hypercapnic respiratory failure secondary to combination of bilateral pleural effusion with suspected right lower lobe pneumonia. This was confirmed with CT chest although readings shows patient's x-ray looks similar to that of July 2015. Was on  IV Vanco and Zosyn , but remains critical as requires BIPAP still despite therapy.  Blood cultures, urine cx NTD, sputum cultures not accepted. Marland Kitchen Appreciate Pulmonology, palliative care  consults. Despite advanced diuretic patient's in's and outs still  positive, also positive since admission OF ABOUT 4.3 LITERS, CONTINUE o2  as needed, following his oxygenation and shortness of breath. Palliative care d/w family, decided on hospice home placement , dc today  #2 sepsis secondary to pneumonia. Patient presented with fever of 103.6 elevated white count of 17,000 and abnormal chest x-ray. Treatment as above. Sputum cultures are not reported. White blood cell count has normalized.   #3 elevated troponin suspected due to demand ischemia. Patient does have history of coronary artery disease. cardiac enzyme 3 done, but significant increase was not noted, no further interventions recommended.  #4 bilateral upper and lower extremity rash. Suspected cellulitis vs allergic reaction to bactrim. Significant improvement of his lower extremity rash. Resolved now  #5 elevated D dimers- DIC panel reveals a significantly elevated D dimers which are greater than 4000. This can be from the underlying sepsis , d-dimer can be an acute phase reactant in this case . We could not order  CT angiogram of the chest to rule out pulmonary embolism as his renal function is abnormal. VQ scan would be indeterminate in view of his pneumonia and pleural effusions. However as the patient's d-dimer was significantly higher than normal,  Pulmonology evaluated, Dr. Mortimer Fries recommended no w/u for PE , but palliative care, hospice home placement is initiated, dc  today  #6 chronic dysphagia with G-tube feeding , being continued on Jevity. S/p speech therapy evaluation- ok with pleasure eating - nectar thick liquids and puree diet prn  only, pulmonary however is against any PO feeds unless in hospice care.   #7. CK D stage III , off IVF,  as patient has fluid overload. Continue Torsemide , chest xray recently showed worsening pleural effusions.  #8 DVT prophylaxis , was on subcutaneous heparin 3 times a day, now dc   #9 discharge planning with care Management and social worker,  hospice care placement today  10 OSA, OK to use CPAP machine from home  11 . Bilateral pleural effusions, likely heart failure related. Also cannot rule out CK D related to fluid retention. We will be initiating diuretics at the higher dose, watching patient's in's and outs as well as his kidney function.    VITAL SIGNS:  Blood pressure 124/60, pulse 73, temperature 97.6 F (36.4 C), temperature source Oral, resp. rate 20, height '5\' 11"'$  (1.803 m), weight 92.126 kg (203 lb 1.6 oz), SpO2 100 %.  I/O:   Intake/Output Summary (Last 24 hours) at 01/01/15 1103 Last data filed at 2015-01-01 0744  Gross per 24 hour  Intake   1870 ml  Output    550 ml  Net   1320 ml    PHYSICAL EXAMINATION:  GENERAL:  81 y.o.-year-old patient lying in the bed poorly responsive on BIPAP, able to open eyes, nonverbal  EYES: Pupils equal, round, reactive to light and accommodation. No scleral icterus. Extraocular muscles intact.  HEENT: Head atraumatic, normocephalic. Oropharynx and nasopharynx clear.  NECK:  Supple, no jugular venous  distention. No thyroid enlargement, no tenderness.  LUNGS: Normal breath sounds bilaterally, no wheezing, few rales, rhonchi , crepitation at bases. No use of accessory muscles of respiration. BIPAP is on CARDIOVASCULAR: S1, S2 normal. No murmurs, rubs, or gallops.  ABDOMEN: Soft, non-tender, non-distended. Bowel sounds present. No organomegaly or mass. GT in place, no drainage, skin changes, dressed EXTREMITIES: No pedal edema, cyanosis, or clubbing.  NEUROLOGIC: Cranial nerves II through XII are intact. Muscle strength 3/5 in all extremities. Sensation grossly  intact. Gait not checked.  PSYCHIATRIC: The patient is alert and oriented x 3.  SKIN: No obvious rash, lesion, or ulcer. Unna boots on b/l, swelling 2 plus remains.   DATA REVIEW:   CBC  Recent Labs Lab 11/30/14 0527 12/02/14 0426  WBC 8.3  --   HGB 8.6* 9.1*  HCT 26.2*  --   PLT 95*  --     Chemistries   Recent Labs Lab 2015/01/01 0502  NA 145  K 5.5*  CL 105  CO2 33*  GLUCOSE 102*  BUN 71*  CREATININE 1.87*  CALCIUM 8.8*    Cardiac Enzymes No results for input(s): TROPONINI in the last 168 hours.  Microbiology Results  Results for orders placed or performed during the hospital encounter of 11/26/14  Blood Culture (routine x 2)     Status: None   Collection Time: 11/26/14  9:10 AM  Result Value Ref Range Status   Specimen Description BLOOD  Final   Special Requests NONE  Final   Culture NO GROWTH 5 DAYS  Final   Report Status 12/01/2014 FINAL  Final  Blood Culture (routine x 2)     Status: None   Collection Time: 11/26/14  9:12 AM  Result Value Ref Range Status   Specimen Description BLOOD  Final   Special Requests NONE  Final   Culture NO GROWTH 5 DAYS  Final   Report Status 12/01/2014 FINAL  Final  Urine culture     Status: None   Collection Time:  11/26/14  9:28 AM  Result Value Ref Range Status   Specimen Description URINE, CLEAN CATCH  Final   Special Requests NONE  Final   Culture NO GROWTH 2  DAYS  Final   Report Status 11/28/2014 FINAL  Final  C difficile quick scan w PCR reflex [facility])     Status: None   Collection Time: 11/29/14  9:45 AM  Result Value Ref Range Status   C Diff antigen POSITIVE  Final   C Diff toxin NEGATIVE  Final   C Diff interpretation   Final    Negative for toxigenic C. difficile. Toxin gene and active toxin production not detected. May be a nontoxigenic strain of C. difficile bacteria present, lacking the ability to produce toxin.  Clostridium Difficile by PCR     Status: None   Collection Time: 11/29/14  9:45 AM  Result Value Ref Range Status   C difficile by pcr NEGATIVE NEGATIVE Final  Culture, sputum-assessment     Status: None (Preliminary result)   Collection Time: 12/01/14  6:00 PM  Result Value Ref Range Status   Specimen Description SPUTUM  Final   Special Requests NONE  Final   Sputum evaluation   Final    Sputum specimen not acceptable for testing.  Please recollect.   C/CHARLIE FLEETWOOD AT 1904 12/01/14.PMH   Report Status PENDING  Incomplete  Culture, expectorated sputum-assessment     Status: None (Preliminary result)   Collection Time: 12/01/14  8:26 PM  Result Value Ref Range Status   Specimen Description SPUTUM  Final   Special Requests NONE  Final   Sputum evaluation   Final    Sputum specimen not acceptable for testing.  Please recollect.   C/ADRIANNE WHITE AT 2103 12/01/14.PMH   Report Status PENDING  Incomplete    RADIOLOGY:  No results found.  EKG:   Orders placed or performed during the hospital encounter of 11/26/14  . EKG 12-Lead  . EKG 12-Lead  . EKG 12-Lead  . EKG 12-Lead      Management plans discussed with the patient, family and they are in agreement.  CODE STATUS:     Code Status Orders        Start     Ordered   12/13/2014 0956  Do not attempt resuscitation (DNR)   Continuous    Question Answer Comment  In the event of cardiac or respiratory ARREST Do not call a "code blue"   In the event of  cardiac or respiratory ARREST Do not perform Intubation, CPR, defibrillation or ACLS   In the event of cardiac or respiratory ARREST Use medication by any route, position, wound care, and other measures to relive pain and suffering. May use oxygen, suction and manual treatment of airway obstruction as needed for comfort.      12/13/14 0955    Advance Directive Documentation        Most Recent Value   Type of Advance Directive  Healthcare Power of Attorney   Pre-existing out of facility DNR order (yellow form or pink MOST form)     "MOST" Form in Place?        TOTAL TIME TAKING CARE OF THIS PATIENT: 45  minutes.    Theodoro Grist M.D on 12-13-2014 at 11:03 AM  Between 7am to 6pm - Pager - [phone]  After 6pm go to [email or web address] - password [email or web address] [facility]  [facility]  Office  [phone]  CC: Primary care physician; Maryland Pink, MD

## 2014-12-18 NOTE — Consult Note (Signed)
Palliative Medicine Inpatient Consult Follow Up Note   Name: Mathew Morris Date: 12-31-14 MRN: [id number]  DOB: [birth year]  Referring Physician: Max Sane, MD  Palliative Care consult requested for this 81 y.o. male for goals of medical therapy in patient with with COPD, CM, CKD, pulmonary HTN, CAD, PVD admitted with pneumonia.  Pt resting in bed on  and appears in NAD distress.  Family not at bedside. Marland Kitchen    REVIEW OF SYSTEMS:  Pain: None Dyspnea:  No Nausea/Vomiting:  No Diarrhea:  Yes and given kayexalate due to kyperkalemia All other systems were reviewed and found to be negative  CODE STATUS: Full code   PAST MEDICAL HISTORY: Past Medical History  Diagnosis Date  . CHF (congestive heart failure)   . Dysphagia   . Anemia   . Hypothyroid   . Hypertension   . Enlarged prostate   . Chronic kidney disease   . Arthritis   . Hyperlipidemia   . GERD (gastroesophageal reflux disease)   . Gout   . Lung cancer   . Obstructive sleep apnea     PAST SURGICAL HISTORY:  Past Surgical History  Procedure Laterality Date  . Cardiac catheterization    . Lobectomy Left   . Thyroidectomy    . Bilateral carpal tunnel release Bilateral     Vital Signs: BP 124/60 mmHg  Pulse 73  Temp(Src) 97.6 F (36.4 C) (Oral)  Resp 20  Ht '5\' 11"'$  (1.803 m)  Wt 92.126 kg (203 lb 1.6 oz)  BMI 28.34 kg/m2  SpO2 100% Filed Weights   12/01/14 2252 12/02/14 0214 12/31/2014 0500  Weight: 92.08 kg (203 lb) 92.534 kg (204 lb) 92.126 kg (203 lb 1.6 oz)    Estimated body mass index is 28.34 kg/(m^2) as calculated from the following:   Height as of this encounter: '5\' 11"'$  (1.803 m).   Weight as of this encounter: 92.126 kg (203 lb 1.6 oz).  PHYSICAL EXAM: Generall: ill-appearing HEENT: OP clear, edentulous, hoarse, hearing intact Neck: Trachea midline, no LAN Cardiovascular: regular rate and rhythm  Pulmonary/Chest: poor air movement, no audible wheeze Abdominal: Soft, NTTP, hypoactive bowel  sounds, PEG tube GU: No SP tenderness, No CVA tenderness Extremities: LE's wrapped from ankles to knees Neurological: global weakness but grossly nonfocal Skin: LE's wrapped, not examined Psychiatric: oriented to place, year,  LABS: CBC:    Component Value Date/Time   WBC 8.3 11/30/2014 0527   WBC 8.9 10/26/2014 0453   HGB 9.1* 12/02/2014 0426   HGB 10.0* 10/26/2014 0453   HCT 26.2* 11/30/2014 0527   HCT 31.3* 10/26/2014 0453   PLT 95* 11/30/2014 0527   PLT 93* 10/26/2014 0453   MCV 96.9 11/30/2014 0527   MCV 95 10/26/2014 0453   NEUTROABS 16.3* 11/26/2014 0910   NEUTROABS 7.1* 10/26/2014 0453   LYMPHSABS 0.4* 11/26/2014 0910   LYMPHSABS 0.9* 10/26/2014 0453   MONOABS 0.5 11/26/2014 0910   MONOABS 0.7 10/26/2014 0453   EOSABS 0.0 11/26/2014 0910   EOSABS 0.2 10/26/2014 0453   BASOSABS 0.0 11/26/2014 0910   BASOSABS 0.0 10/26/2014 0453   Comprehensive Metabolic Panel:    Component Value Date/Time   NA 145 2014/12/31 0502   NA 142 10/26/2014 0453   K 5.5* 12-31-2014 0502   K 4.0 10/26/2014 0453   CL 105 December 31, 2014 0502   CL 109 10/26/2014 0453   CO2 33* 31-Dec-2014 0502   CO2 28 10/26/2014 0453   BUN 71* 12-31-2014 0502   BUN 38* 10/26/2014 0453  CREATININE 1.87* 12-Dec-2014 0502   CREATININE 1.52* 10/26/2014 0453   GLUCOSE 102* 12/12/14 0502   GLUCOSE 112* 10/26/2014 0453   CALCIUM 8.8* Dec 12, 2014 0502   CALCIUM 7.8* 10/26/2014 0453   AST 48* 11/26/2014 0910   AST 22 10/24/2014 0417   ALT 58 11/26/2014 0910   ALT 28 10/24/2014 0417   ALKPHOS 143* 11/26/2014 0910   ALKPHOS 69 10/24/2014 0417   BILITOT 1.1 11/26/2014 0910   PROT 7.9 11/26/2014 0910   PROT 5.8* 10/24/2014 0417   ALBUMIN 3.0* 11/26/2014 0910   ALBUMIN 2.3* 10/24/2014 0417    IMPRESSION: Mr Hippler is an 81 yo man with PMH of severe pulmonary HTN (RVSP 34mHg by ECHO 09/2013), CM with EF 30-35%, COPD on 2L 02, VDRF (09/2013), CAD (100% RCA not stented), PVD, dysphagia s/p PEG, SCLC s/p  lobectomy, OSA, CKD III, AOCD, thyroidectomy with hypothyroidism, HTN, hyperlipidemia, BPH, GERD. He was admitted 11/26/14 with fever and altered mental status. Work-up significant for B.pneumonia. Pt has not improved significantly since admission.   Pt with respiratory acidosis not on labs.  Pt refused Bi-Pap last night.  Explained to pt about what may happen with rising CO2 levels and pt agrees to trial of Bi-Pap.  RT notified.  Pt denies and SOB or pain.  Will attempt to call wife for continued goals of care .  PLAN: 1. Full Code 2. Bi-Pap trial    More than 50% of the visit was spent in counseling/coordination of care: YES  Time Spent: 35 minutes

## 2014-12-18 NOTE — Progress Notes (Signed)
Dr. Reece Levy notified Potassium has risen from 5.2 to 5.5. Order for Kayexelate 45 g rectally once. Recheck Potassium in 4 hours.

## 2014-12-18 NOTE — Progress Notes (Signed)
PT Cancellation Note  Patient Details Name: Mathew Morris MRN: [id number] DOB: October 10, 1923   Cancelled Treatment:    Reason Eval/Treat Not Completed: Patient declined, no reason specified. Pt refusing therapy/interventions at this time. Palliative care working with pt/family.   Bishop 2014-12-31, 10:28 AM

## 2014-12-18 NOTE — Consult Note (Signed)
Pt is not  cooperative with Bi-Pap and appears confused.  Wife now at bedside and wishes for pt to be a DNR. Wife states pt would not want to be resuscitated at this point.  Will continue goals of care conversation as pt is critically ill.

## 2014-12-18 NOTE — Plan of Care (Signed)
Problem: Discharge Progression Outcomes Goal: Discharge plan in place and appropriate Individualization of Care Outcome: Progressing Pt likes to be called Mathew Morris Lives at home with spouse Pt has a hx of dysphagia- on bolus TF, COPD, CKD, anemia, small lung ca, hypertension and sleep apnea (pt utilizes own Cpap). Pt co-morbidities are currently medically managed by home medications.  Goal: Other Discharge Outcomes/Goals Outcome: Progressing Patient is calm, cooperative. No c/o of pain. Oral swabs given. Bolus feedings continued with no residue. Patient up in chair, resting quietly.

## 2014-12-18 NOTE — Progress Notes (Signed)
Pt transferred to hospice home via stretcher by EMS  Donnita Falls, RN. 06-Dec-2014 2:32 PM

## 2014-12-18 NOTE — Progress Notes (Signed)
[facility] Encounter Note  Patient: Mathew Morris / Admit Date: 11/26/2014 / Date of Encounter: 2014/12/11, 12:30 PM   Subjective: Patient is not responding and short of breath  Review of Systems: Positive for: Unable to assess   Objective: Telemetry: Not performed Physical Exam: Blood pressure 124/60, pulse 73, temperature 97.6 F (36.4 C), temperature source Oral, resp. rate 20, height '5\' 11"'$  (1.803 m), weight 203 lb 1.6 oz (92.126 kg), SpO2 100 %. Body mass index is 28.34 kg/(m^2). General: Disheveled Head: Normocephalic, atraumatic, sclera non-icteric, no xanthomas, nares are without discharge. Neck: No apparent masses Lungs: Normal respirations with diffuse wheezes, no rhonchi, basilar rales , few crackles   Heart: Regular rate and rhythm, normal S1 S2, no murmur, no rub, no gallop, PMI is normal size and placement, carotid upstroke normal without bruit, jugular venous pressure normal Abdomen: Soft, non-tender, non-distended with normoactive bowel sounds. No hepatosplenomegaly. Abdominal aorta is normal size without bruit Extremities: Trace to 1+ edema, no clubbing, no cyanosis, no ulcers,  Peripheral: 2+ radial, 2+ femoral, 2+ dorsal pedal pulses Neuro: Not responsive.   Intake/Output Summary (Last 24 hours) at 12-11-14 1230 Last data filed at 12/11/14 1143  Gross per 24 hour  Intake   2450 ml  Output    750 ml  Net   1700 ml    Inpatient Medications:  . allopurinol  100 mg Oral BID  . aspirin EC  325 mg Oral Daily  . atorvastatin  10 mg Oral Daily  . carvedilol  6.25 mg Oral BID WC  . clopidogrel  75 mg Oral Daily  . dronabinol  5 mg Oral BID AC  . feeding supplement (JEVITY 1.2 CAL)  237 mL Per Tube Q0600  . feeding supplement (JEVITY 1.2 CAL)  480 mL Per Tube TID  . ferrous sulfate  325 mg Oral BID WC  . finasteride  5 mg Oral Daily  . GELOCAST UNNAS BOOT  2 application Apply externally Once  . heparin  5,000 Units Subcutaneous 3 times per  day  . nitroGLYCERIN  0.4 mg Transdermal Daily  . pantoprazole  40 mg Oral Daily  . piperacillin-tazobactam (ZOSYN)  IV  3.375 g Intravenous 3 times per day  . senna  1 tablet Oral BID  . tamsulosin  0.4 mg Oral Daily  . torsemide  40 mg Oral BID  . vancomycin  1,250 mg Intravenous Once  . vancomycin  1,000 mg Intravenous Q36H   Infusions:    Labs:  Recent Labs  12/02/14 0426 12/02/14 2117 12-11-2014 0502  NA 145  --  145  K 5.4* 5.2* 5.5*  CL 106  --  105  CO2 33*  --  33*  GLUCOSE 129*  --  102*  BUN 68*  --  71*  CREATININE 1.75*  --  1.87*  CALCIUM 8.8*  --  8.8*   No results for input(s): AST, ALT, ALKPHOS, BILITOT, PROT, ALBUMIN in the last 72 hours.  Recent Labs  12/02/14 0426  HGB 9.1*   No results for input(s): CKTOTAL, CKMB, TROPONINI in the last 72 hours. Invalid input(s): POCBNP No results for input(s): HGBA1C in the last 72 hours.   Weights: Filed Weights   12/01/14 2252 12/02/14 0214 11-Dec-2014 0500  Weight: 203 lb (92.08 kg) 204 lb (92.534 kg) 203 lb 1.6 oz (92.126 kg)     Radiology/Studies:  Ct Abdomen Pelvis Wo Contrast  11/26/2014   CLINICAL DATA:  81 year old male with sepsis, fever of unknown origin,  chronic kidney disease. Initial encounter.  EXAM: CT CHEST, ABDOMEN AND PELVIS WITHOUT CONTRAST  TECHNIQUE: Multidetector CT imaging of the chest, abdomen and pelvis was performed following the standard protocol without IV contrast.  COMPARISON:  Chest CT 01/24/2014. Renal ultrasound 10/12/2014. Abdomen MRI 10/17/2006. Chest CT 06/10/2014.  FINDINGS: CT CHEST FINDINGS  Moderate bilateral pleural effusions, layering on the right and at least partially loculated on the left. Superimposed widespread pulmonary septal thickening, patchy peribronchovascular ground-glass opacity in the upper lobes, and lower lobe peribronchial thickening and some lower lobe consolidation worse on the right. Major airways are patent. The appearance of the lungs and pleura is very  similar to that in July 2015, but was resolved on the intervening 06/10/2014 chest CT.  Incidental intravenous gas at the right subclavian and IJ. Cardiomegaly with aortic and coronary artery calcified plaque. No pericardial effusion.  Degenerative changes at both sternoclavicular joints appear stable. Mediastinal lymph nodes are mildly increased and appear reactive in nature, including an 11 mm prevascular node on series 4, image 20.  Diffuse degenerative changes and widespread ankylosis in the spine. Bulky ste lower cervical and thoracic flowing osteophytes. No acute osseous abnormality identified.  CT ABDOMEN AND PELVIS FINDINGS  Lower thoracic and lumbar flowing osteophytes with widespread ankylosis. Superimposed advanced lumbar disc and endplate degeneration. Partial ankylosis of both SI joints. Moderate to severe degenerative changes at both hips with subchondral cysts. No acute osseous abnormality identified.  Widespread calcified atherosclerosis including involvement of the abdominal aorta and its branches.  Widespread body wall subcutaneous fat stranding.  Foley catheter in place, the bladder is decompressed. Mild presacral stranding. Trace pelvic free fluid (series 4, image 110). The rectum is decompressed and unremarkable.  Redundant sigmoid colon with gas and stool. More decompressed left colon with mild diverticulosis and retained stool. Redundant transverse colon with retained stool. Diverticulosis in the right colon with retained stool. Normal appendix. The cecum is mostly located in the pelvis. Oral contrast has just reached the terminal ileum. No dilated or abnormal small bowel loops identified. Percutaneous gastrostomy tube. Contrast in the stomach and duodenum with no adverse features.  Negative non contrast liver, gallbladder, spleen, pancreas, and adrenal glands. No hydronephrosis. Diminutive appearance of both kidneys. 12 mm lobulated calcification at the left renal hilum could be in the  collecting system or other dystrophic nephrocalcinosis. No hydroureter. No abdominal free fluid identified. No free air. No lymphadenopathy. Ectatic common iliac arteries.  IMPRESSION: 1. Moderate size layering right and loculated left pleural effusions with bilateral pneumonia. The appearance of the chest is similar to that in July 2015. 2. There may be superimposed pulmonary edema, with evidence of some body wall edema diffusely. And volume overload might also explain trace pelvic free fluid. 3. No other acute or inflammatory process identified. 4. Extensive atherosclerosis. Cardiomegaly. Severe diffuse idiopathic skeletal hyperostosis.   Electronically Signed   By: Genevie Ann M.D.   On: 11/26/2014 14:37   Dg Chest 2 View  11/30/2014   CLINICAL DATA:  COPD, CHF, history of lung cancer.  EXAM: CHEST  2 VIEW  COMPARISON:  11/26/2014 and 10/22/2014 as well as chest CT 11/26/2014  FINDINGS: Lungs are adequately inflated with stable opacification the left base likely moderate size effusion with atelectasis as there is less of a loculated component laterally. Worsening right base opacification likely worsening small to moderate right pleural effusion with associated atelectasis. Cannot exclude infection in the lung bases. Cardiomediastinal silhouette and remainder the exam is unchanged.  IMPRESSION: Stable  left base opacification compatible with moderate effusion with atelectasis. Less loculation laterally. Worsening small to moderate right pleural effusion likely with associated atelectasis. Cannot exclude infection in the lung bases.   Electronically Signed   By: Marin Olp M.D.   On: 11/30/2014 14:52   Ct Chest Wo Contrast  11/26/2014   CLINICAL DATA:  81 year old male with sepsis, fever of unknown origin, chronic kidney disease. Initial encounter.  EXAM: CT CHEST, ABDOMEN AND PELVIS WITHOUT CONTRAST  TECHNIQUE: Multidetector CT imaging of the chest, abdomen and pelvis was performed following the standard  protocol without IV contrast.  COMPARISON:  Chest CT 01/24/2014. Renal ultrasound 10/12/2014. Abdomen MRI 10/17/2006. Chest CT 06/10/2014.  FINDINGS: CT CHEST FINDINGS  Moderate bilateral pleural effusions, layering on the right and at least partially loculated on the left. Superimposed widespread pulmonary septal thickening, patchy peribronchovascular ground-glass opacity in the upper lobes, and lower lobe peribronchial thickening and some lower lobe consolidation worse on the right. Major airways are patent. The appearance of the lungs and pleura is very similar to that in July 2015, but was resolved on the intervening 06/10/2014 chest CT.  Incidental intravenous gas at the right subclavian and IJ. Cardiomegaly with aortic and coronary artery calcified plaque. No pericardial effusion.  Degenerative changes at both sternoclavicular joints appear stable. Mediastinal lymph nodes are mildly increased and appear reactive in nature, including an 11 mm prevascular node on series 4, image 20.  Diffuse degenerative changes and widespread ankylosis in the spine. Bulky ste lower cervical and thoracic flowing osteophytes. No acute osseous abnormality identified.  CT ABDOMEN AND PELVIS FINDINGS  Lower thoracic and lumbar flowing osteophytes with widespread ankylosis. Superimposed advanced lumbar disc and endplate degeneration. Partial ankylosis of both SI joints. Moderate to severe degenerative changes at both hips with subchondral cysts. No acute osseous abnormality identified.  Widespread calcified atherosclerosis including involvement of the abdominal aorta and its branches.  Widespread body wall subcutaneous fat stranding.  Foley catheter in place, the bladder is decompressed. Mild presacral stranding. Trace pelvic free fluid (series 4, image 110). The rectum is decompressed and unremarkable.  Redundant sigmoid colon with gas and stool. More decompressed left colon with mild diverticulosis and retained stool. Redundant  transverse colon with retained stool. Diverticulosis in the right colon with retained stool. Normal appendix. The cecum is mostly located in the pelvis. Oral contrast has just reached the terminal ileum. No dilated or abnormal small bowel loops identified. Percutaneous gastrostomy tube. Contrast in the stomach and duodenum with no adverse features.  Negative non contrast liver, gallbladder, spleen, pancreas, and adrenal glands. No hydronephrosis. Diminutive appearance of both kidneys. 12 mm lobulated calcification at the left renal hilum could be in the collecting system or other dystrophic nephrocalcinosis. No hydroureter. No abdominal free fluid identified. No free air. No lymphadenopathy. Ectatic common iliac arteries.  IMPRESSION: 1. Moderate size layering right and loculated left pleural effusions with bilateral pneumonia. The appearance of the chest is similar to that in July 2015. 2. There may be superimposed pulmonary edema, with evidence of some body wall edema diffusely. And volume overload might also explain trace pelvic free fluid. 3. No other acute or inflammatory process identified. 4. Extensive atherosclerosis. Cardiomegaly. Severe diffuse idiopathic skeletal hyperostosis.   Electronically Signed   By: Genevie Ann M.D.   On: 11/26/2014 14:37   Dg Chest Port 1 View  11/26/2014   CLINICAL DATA:  81 year old male with history of lung cancer, congestive heart and hypertension presenting with fever and chills.  Initial encounter.  EXAM: PORTABLE CHEST - 1 VIEW  COMPARISON:  10/22/2014.  FINDINGS: Progressive consolidation lung bases greater on the left. This may represent bibasilar infectious infiltrates superimposed upon post therapy changes of the left lung base.  Pulmonary vascular congestion/ mild pulmonary edema.  No gross pneumothorax.  Cardiomegaly.  Calcified tortuous aorta.  Limited for evaluating for presence of underlying mass. Recommend followup until clearance.  Bilateral shoulder joint  degenerative changes.  IMPRESSION: Progressive consolidation lung bases greater on the left. This may represent bibasilar infectious infiltrates superimposed upon post therapy changes of the left lung base.  Pulmonary vascular congestion/ mild pulmonary edema.   Electronically Signed   By: Genia Del M.D.   On: 11/26/2014 09:35     Assessment and Recommendation  81 y.o. male with known acute on chronic systolic dysfunction congestive heart failure with stage II chronic obstructive pulmonary disease status post sepsis and bilateral pneumonia with pleural effusions with sleep apnea and elevated troponin consistent with demand ischemia and chronic kidney disease with poor long-term prognosis due to significant concerns of above 1. No further cardiac intervention at this time 2. Supportive care and hospice as needed 3. Call if further questions  Signed, Serafina Royals M.D. FACC

## 2014-12-18 NOTE — Progress Notes (Signed)
[place] referral received following and Palliative Care Consult. Mathew Morris is an 81 year old man who lives at home with his wife. He was brought to the ED via EMS for respiratory distress, was hypoxic with elevated temp, CXR showed bilateral pleural effusions, antibiotic therapy was initiated. Patient has required bipap and is currently minimally responsive. PMH includes CHF (ejection fraction 30%), small cell lung Ca with resection, COPD O2 dependent on 2 liters, dysphasia with G tube placement, sleep apnea with use of CPAP at home, CKD stage III, HTN, BPH, Hypothyroidism, arthritis and gout. Patient has not improved with medical interventions and after a Palliative Care consult family has chosen to pursue comfort at the hospice home. Patient seen at bedside, appears restless and dyspneic,Bipap in place,  Probation officer notified staff RN Leana Roe, patient received lorazepam 1 mg IV which was effective. Met with patient's wife Mathew Morris and daughter Mathew Morris in the room, patient did not participate in the conversation. Hospice services explained, questions answered. Both patient's wife and his daughter had questions regarding stopping the tube feeds. Writer initiated education regarding risks of aspiration and the natural progression at end of life for the body not to need food, both voiced understanding. Consents signed. Information faxed to referral intake, report called to hospice home, EMS notified. Portable DNR in place in patient's discharge packet. MD, CSW, CM, RN and family all aware of plan for discharge to [facility] [place]. Thank you for the opportunity to serve Mathew Morris and his family. Flo Shanks RN, BSN, [place] [facility] [place], [facility] [phone] c

## 2014-12-18 NOTE — Consult Note (Signed)
Hospice Home Discharge Orders   Patient:  Mathew Morris is an 81 y.o., male MRN:  [id number] DOB:  February 08, 1924 Patient phone:  [phone] (home)  Patient address:   [place] [address],     Admit: Admit to Hospice Home  Code Status: DNR  Diet: as tolerated  Activity: as tolerated  Oxygen: use for comfort  Foley: Leave foley cath for urinary incontinence/previous skin breakdown   Date of Admission:  11/26/2014   Principal Problem: <principal problem not specified> Discharge Diagnoses: Patient Active Problem List   Diagnosis Date Noted  . Sepsis [A41.9] 11/26/2014  . Chronic systolic heart failure [I69.62] 11/12/2014  . Orthostatic hypotension [I95.1] 11/12/2014  . Dysphagia [R13.10] 11/12/2014      Medications: May crush or use liquid when appropriate. May change to rectal route if unable to swallow.    Medication List    STOP taking these medications        aspirin 325 MG tablet     atorvastatin 10 MG tablet  Commonly known as:  LIPITOR     carvedilol 6.25 MG tablet  Commonly known as:  COREG     clopidogrel 75 MG tablet  Commonly known as:  PLAVIX     dexlansoprazole 60 MG capsule  Commonly known as:  DEXILANT     dronabinol 5 MG capsule  Commonly known as:  MARINOL     ferrous sulfate 325 (65 FE) MG tablet     HYDROcodone-acetaminophen 5-325 MG per tablet  Commonly known as:  NORCO/VICODIN     isosorbide mononitrate 120 MG 24 hr tablet  Commonly known as:  IMDUR     potassium chloride 20 MEQ packet  Commonly known as:  KLOR-CON     ramipril 2.5 MG capsule  Commonly known as:  ALTACE     spironolactone 25 MG tablet  Commonly known as:  ALDACTONE     sulfamethoxazole-trimethoprim 800-160 MG per tablet  Commonly known as:  BACTRIM DS,SEPTRA DS      TAKE these medications        acetaminophen 650 MG suppository  Commonly known as:  TYLENOL  Place 1 suppository (650 mg total) rectally every 6 (six) hours as  needed for mild pain (or Fever >/= 101).     allopurinol 100 MG tablet  Commonly known as:  ZYLOPRIM  Take 100 mg by mouth 2 (two) times daily.     carboxymethylcellulose 0.5 % Soln  Commonly known as:  REFRESH PLUS  Place 1 drop into both eyes 2 (two) times daily as needed.     finasteride 5 MG tablet  Commonly known as:  PROSCAR  Take 5 mg by mouth daily.     LORazepam 1 MG tablet  Commonly known as:  ATIVAN  Take 1 tablet (1 mg total) by mouth every hour as needed for anxiety.     nitroGLYCERIN 0.4 MG SL tablet  Commonly known as:  NITROSTAT  Place 0.4 mg under the tongue every 5 (five) minutes as needed for chest pain.     ondansetron 4 MG tablet  Commonly known as:  ZOFRAN  Take 1 tablet (4 mg total) by mouth every 6 (six) hours as needed for nausea.     tamsulosin 0.4 MG Caps capsule  Commonly known as:  FLOMAX  Take 0.4 mg by mouth daily.     torsemide 20 MG tablet  Commonly known as:  DEMADEX  Take 20 mg by mouth daily as needed (for edema  or weight gain).           Comments:    SignedGrayland Jack Cinthia Rodden 12-21-2014, 10:44 AM

## 2014-12-18 NOTE — Clinical Social Work Note (Signed)
CSW advised that patient's family has decided to go the hospice route and is appropriate for residential hospice. CSW spoke with wife of 40 years who prefers facility on [place] which is near the family. CSW has been advised there is a bed available for him at the [facility] will assist with dc- family appears at peace with plans and comfortable with plans to transition to the hospice home. Support provided.   Eduard Clos, MSW, [place]

## 2014-12-18 DEATH — deceased

## 2014-12-19 LAB — EXPECTORATED SPUTUM ASSESSMENT W REFEX TO RESP CULTURE

## 2014-12-19 LAB — EXPECTORATED SPUTUM ASSESSMENT W GRAM STAIN, RFLX TO RESP C

## 2015-01-02 LAB — EXPECTORATED SPUTUM ASSESSMENT W REFEX TO RESP CULTURE

## 2015-01-02 LAB — EXPECTORATED SPUTUM ASSESSMENT W GRAM STAIN, RFLX TO RESP C

## 2015-01-30 ENCOUNTER — Ambulatory Visit: Payer: Self-pay | Admitting: Family

## 2016-03-17 IMAGING — CR DG CHEST 1V PORT
1 series · 1 of 1 positions shown · non-contrast
Comparison: 10/02/2013

CLINICAL DATA: On ventilator

EXAM:
PORTABLE CHEST - 1 VIEW

[ap]
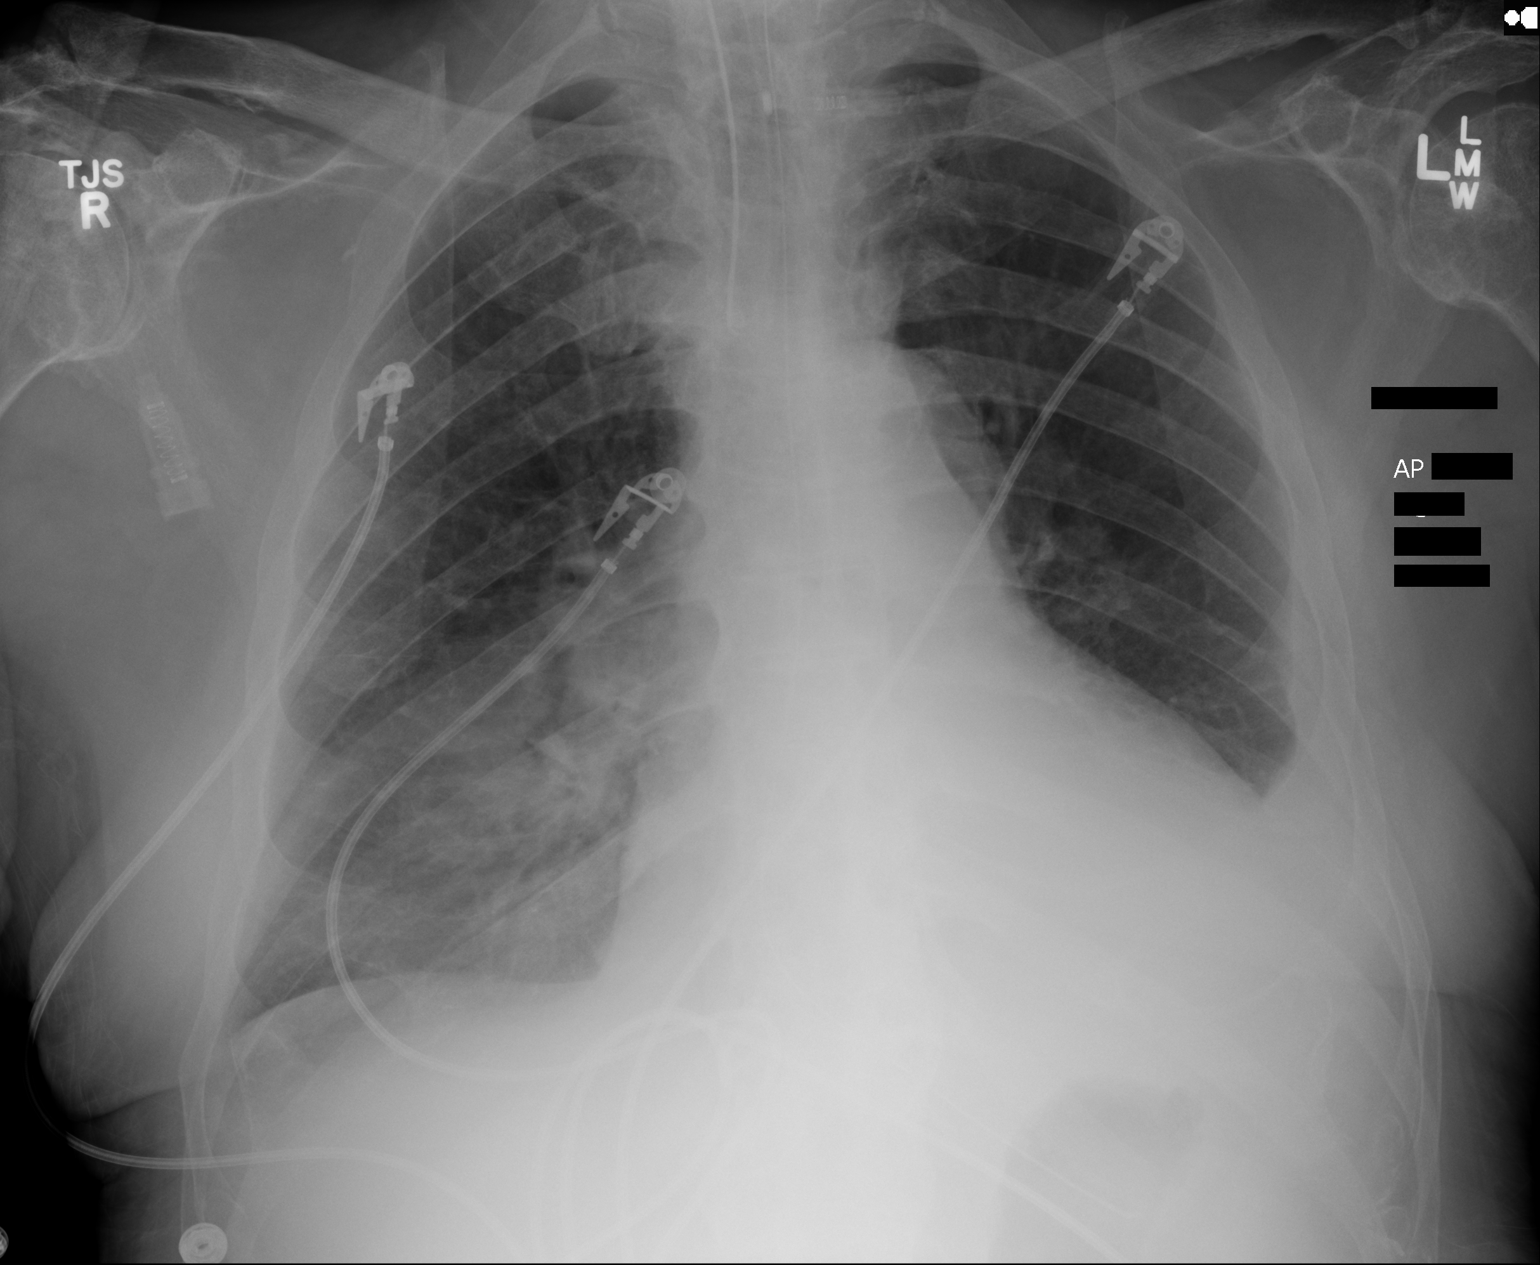

[1 of 1 positions shown; findings below may reference images not displayed]

FINDINGS: Cardiomegaly again noted. Stable loculated left pleural effusion
with left basilar atelectasis or infiltrate. Hazy right basilar
atelectasis or infiltrate. Endotracheal tube in place with tip 6 cm
above the carina. NG tube in place. No convincing pulmonary edema.
Degenerative changes bilateral shoulders.
IMPRESSION: Stable loculated left pleural effusion with left basilar atelectasis
or infiltrate. Hazy right basilar atelectasis or infiltrate.
Endotracheal tube in place with tip 6 cm above the carina. NG tube
in place. No convincing pulmonary edema.

## 2016-03-17 IMAGING — CR DG CHEST 1V PORT
1 series · 1 of 1 positions shown · non-contrast
Comparison: DG CHEST 1V PORT dated 10/03/2013

CLINICAL DATA: Advancement of the endotracheal tube. Reassess
position.

EXAM:
PORTABLE CHEST - 1 VIEW

[ap]
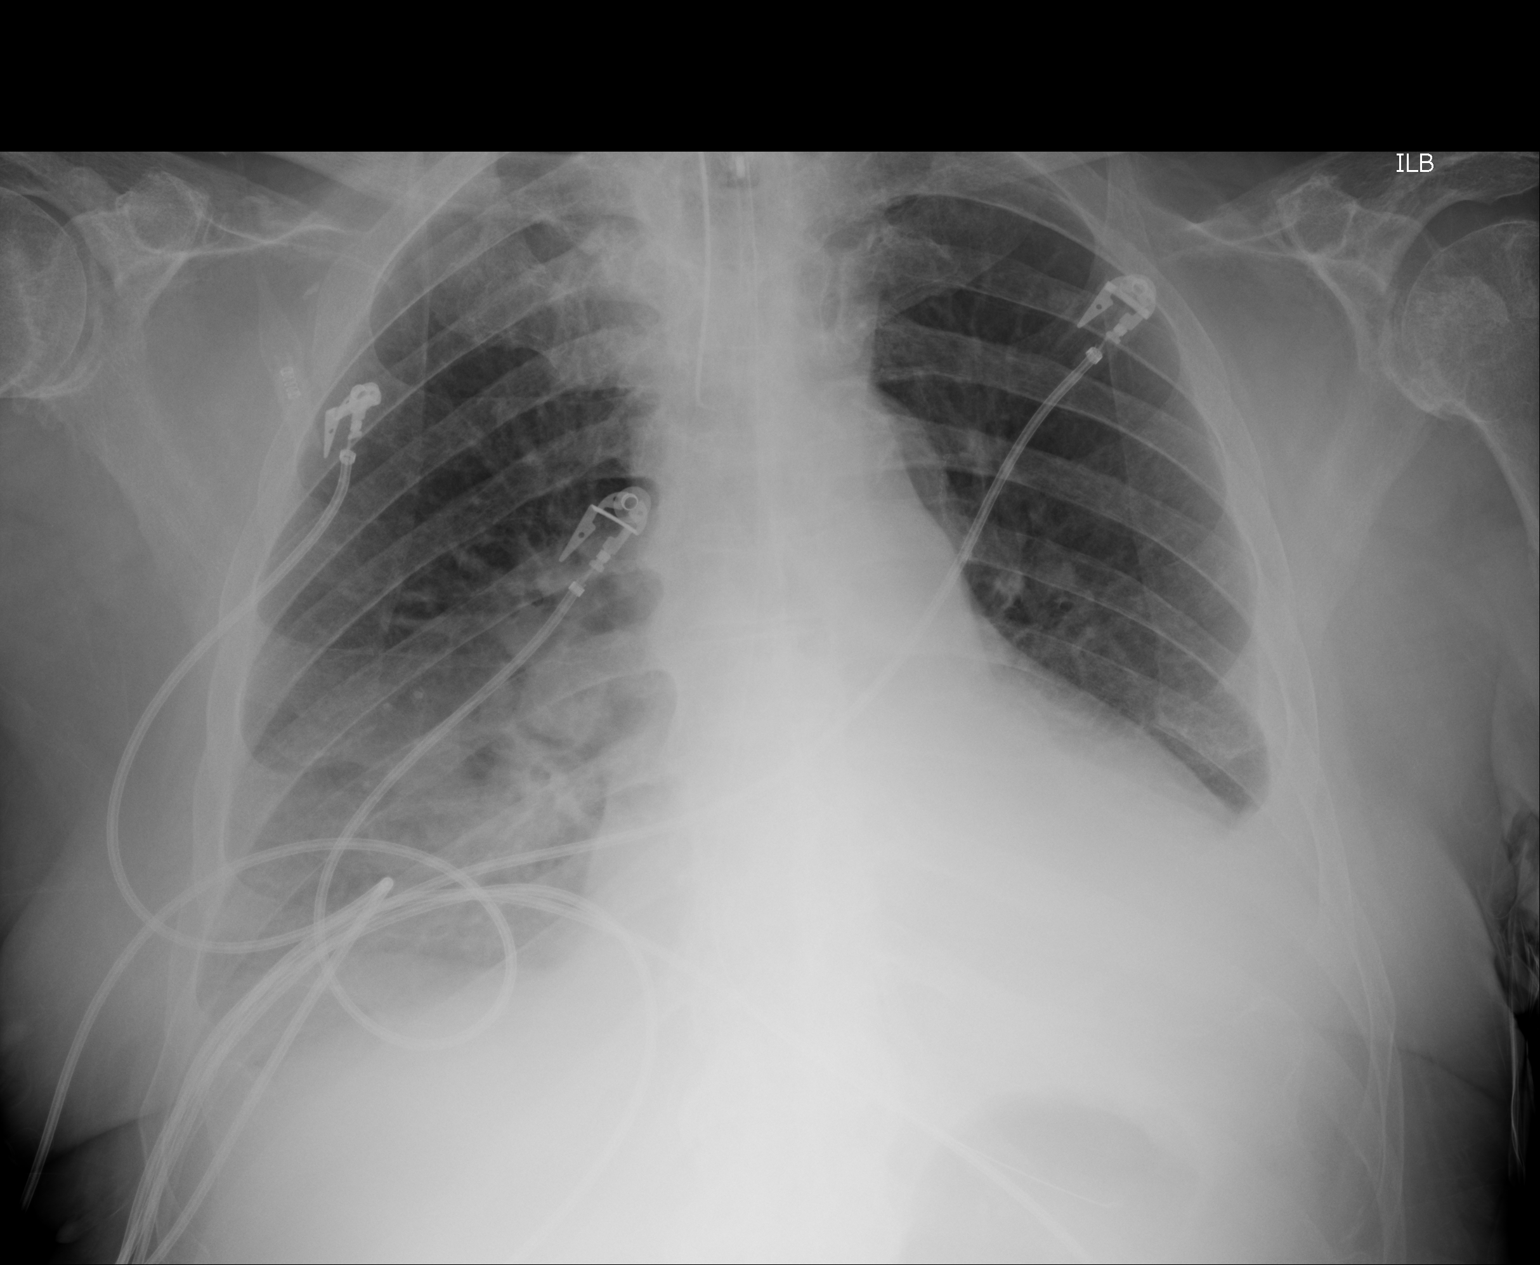

[1 of 1 positions shown; findings below may reference images not displayed]

FINDINGS: The endotracheal tube tip now lies approximately 4.4 cm above the
crotch of the carina. The left lower lobe remains dense and the left
hemidiaphragm remains obscured. On the right there is hazy increased
density likely in the lower lobe. This may reflect layering of
pleural fluid posteriorly, but developing atelectasis or pneumonia
is suspected as well. The cardiopericardial silhouette remains
mildly enlarged. The pulmonary vascularity is not engorged. The
esophagogastric tube tip and proximal port line in the region of the
gastric cardia. Advancement of the esophagogastric tube by 10 cm is
recommended.
IMPRESSION: 1. The endotracheal tube tip now lies 4.4 cm above the crotch of the
carina.
2. There is persistent density at both lung bases. On the right
appears progressive and may reflect developing atelectasis or
pneumonia. Bilateral pleural effusions are suspected.
3. Advancement of the esophagogastric tube by 10 cm is recommended
to assure that the proximal port remains below the GE junction.

## 2016-03-17 IMAGING — US US GUIDE NEEDLE - US [PERSON_NAME]
1 series · 6 of 6 positions shown · non-contrast
Comparison: None available currently.

CLINICAL DATA: Left pleural effusion.

EXAM:
Limited ultrasound examination of left pleural space.
TECHNIQUE: Limited sonographic evaluation of left pleural space.

[Series 1: us guide needle - us (person_name) · 0.26mm/px · 6 of 6 slices shown]
[im 1/6]
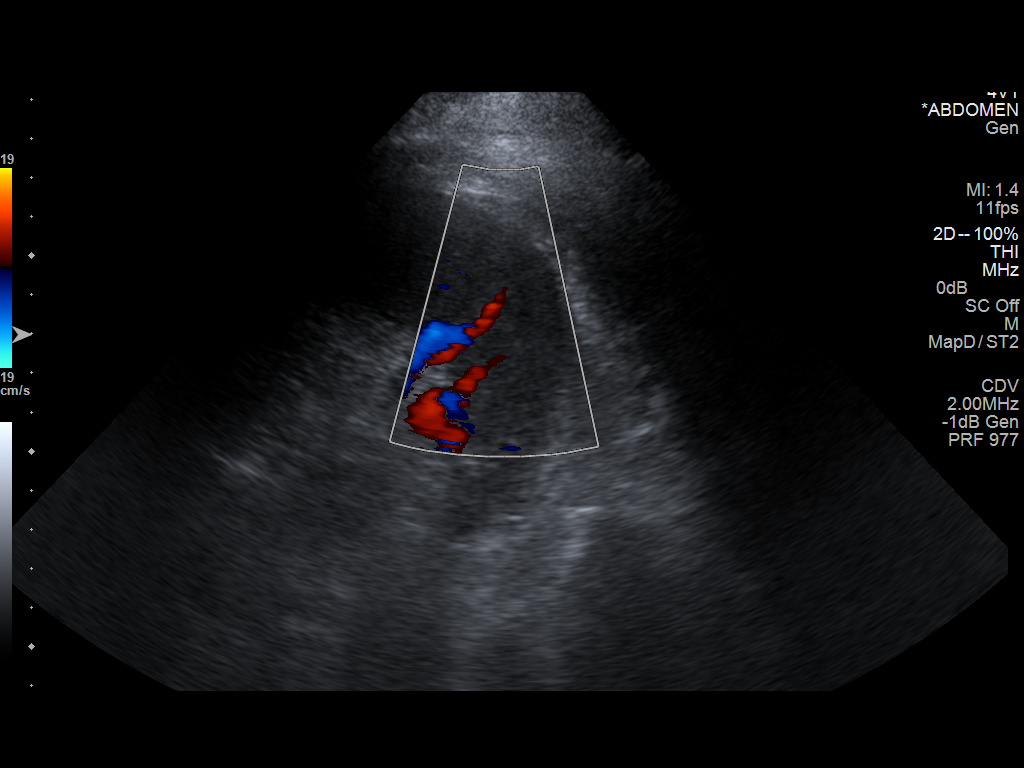
[im 2/6]
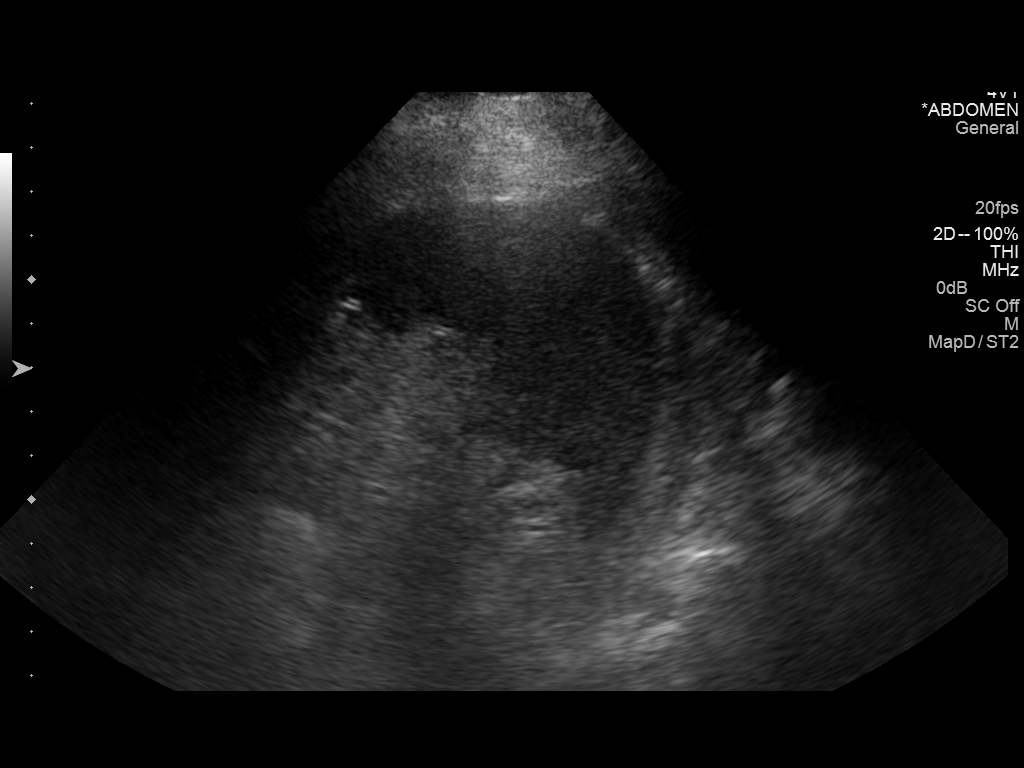
[im 3/6]
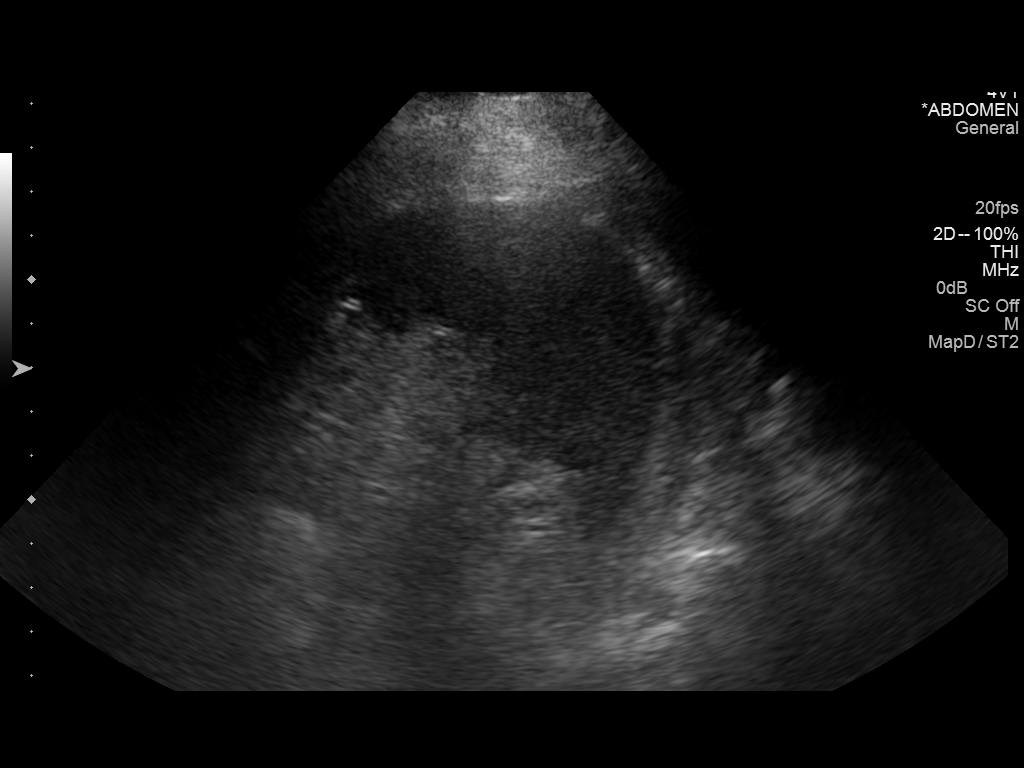
[im 4/6]
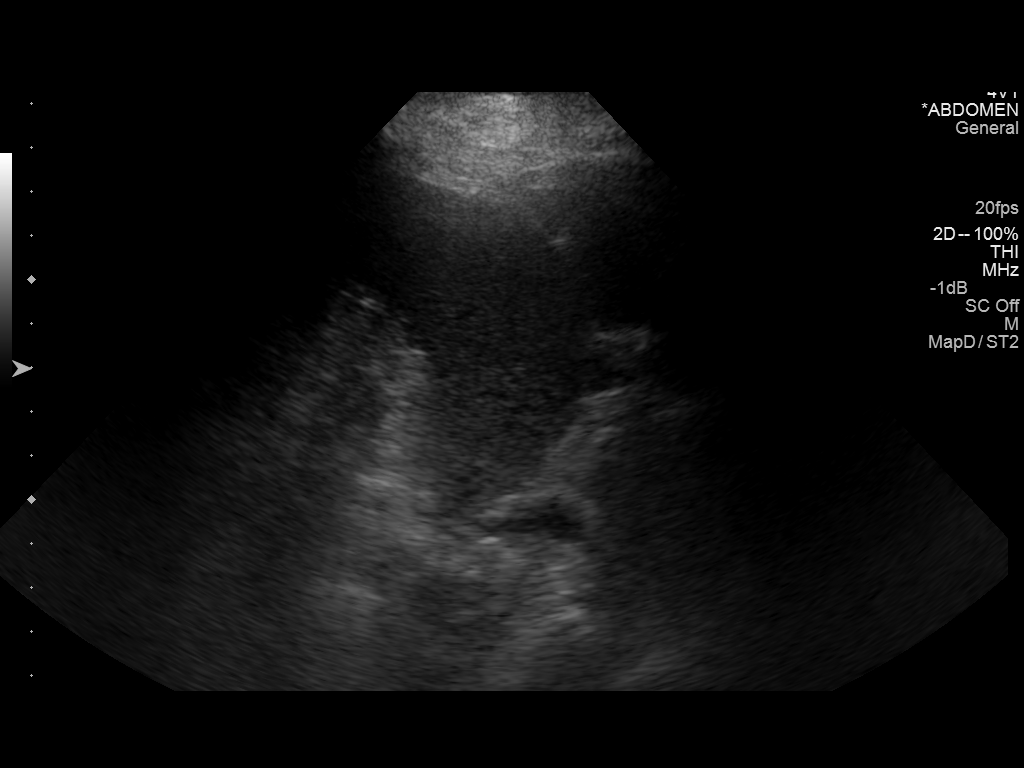
[im 5/6]
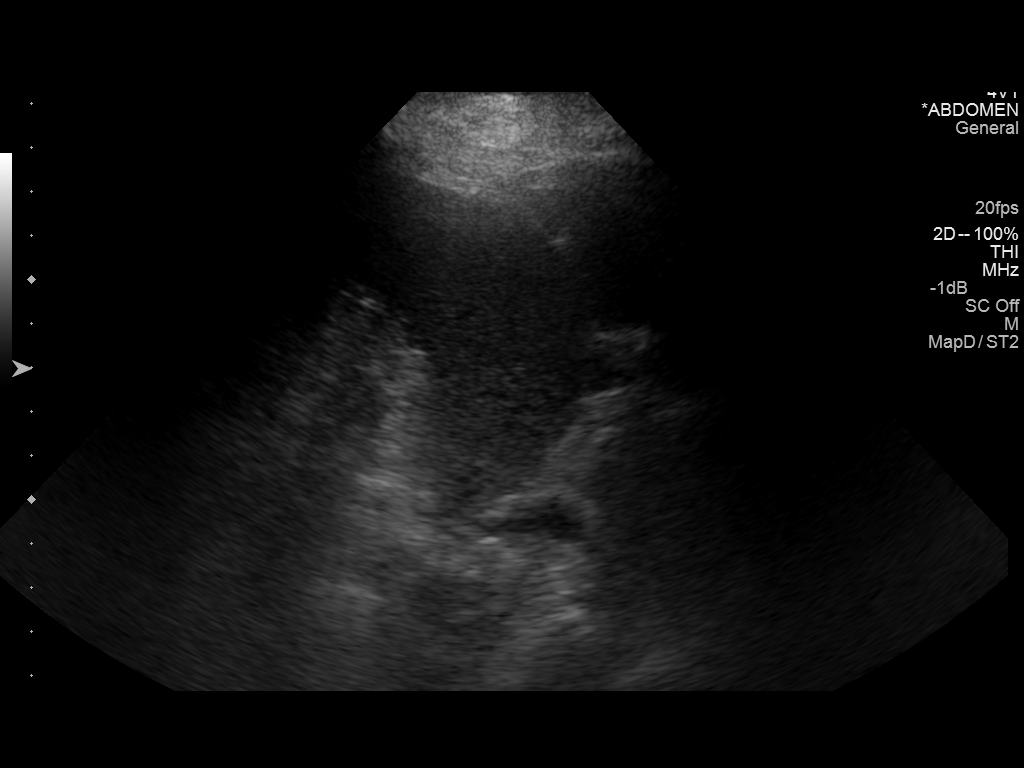
[im 6/6]
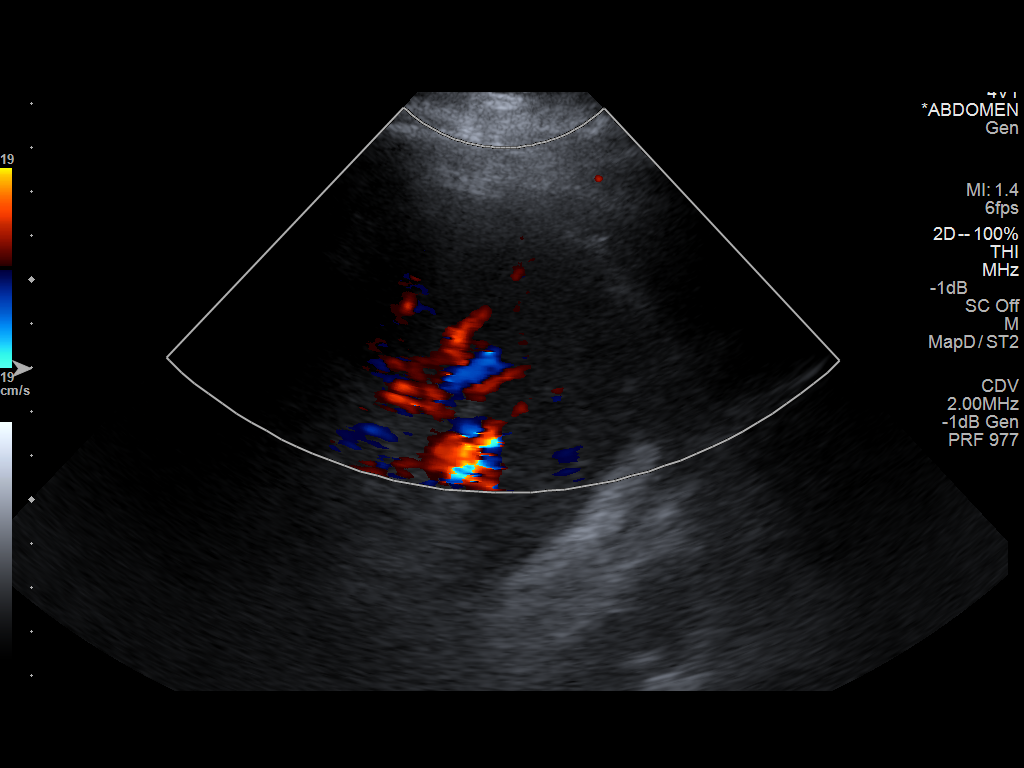

[6 of 6 positions shown; findings below may reference images not displayed]

FINDINGS: No definite pleural effusion is seen on the left side. There appears
to be either inflammatory debris or atelectatic lung in the basilar
region.
IMPRESSION: No definite pleural effusion is seen in the left hemi thorax, and
therefore thoracentesis cannot be performed.

## 2017-05-10 IMAGING — CR DG CHEST 1V PORT
1 series · 1 of 1 positions shown · non-contrast
Comparison: 10/22/2014.

CLINICAL DATA: 89-year-old male with history of lung cancer,
congestive heart and hypertension presenting with fever and chills.
Initial encounter.

EXAM:
PORTABLE CHEST - 1 VIEW

[ap]
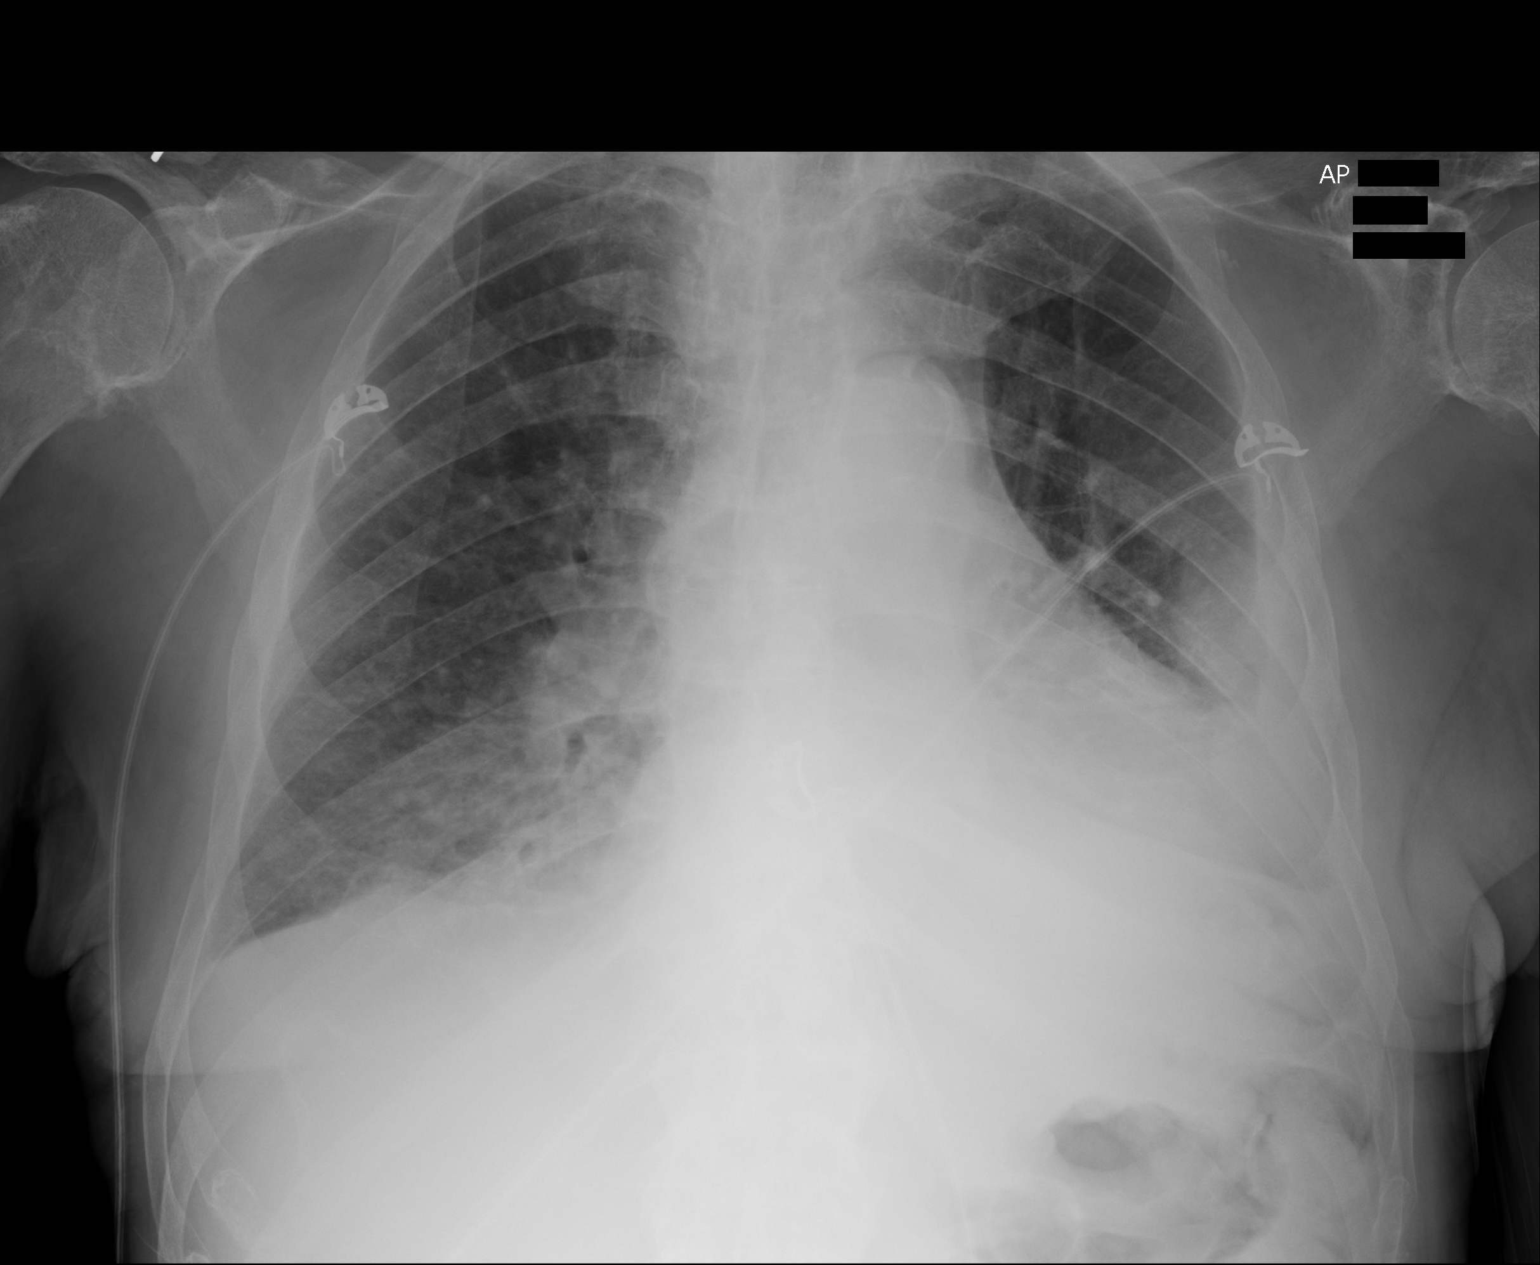

[1 of 1 positions shown; findings below may reference images not displayed]

FINDINGS: Progressive consolidation lung bases greater on the left. This may
represent bibasilar infectious infiltrates superimposed upon post
therapy changes of the left lung base.

Pulmonary vascular congestion/ mild pulmonary edema.

No gross pneumothorax.

Cardiomegaly.

Calcified tortuous aorta.

Limited for evaluating for presence of underlying mass. Recommend
followup until clearance.

Bilateral shoulder joint degenerative changes.
IMPRESSION: Progressive consolidation lung bases greater on the left. This may
represent bibasilar infectious infiltrates superimposed upon post
therapy changes of the left lung base.

Pulmonary vascular congestion/ mild pulmonary edema.

## 2017-05-14 IMAGING — DX DG CHEST 2V
1 series · 2 of 2 positions shown · non-contrast
Comparison: 11/26/2014 and 10/22/2014 as well as chest CT
11/26/2014

CLINICAL DATA: COPD, CHF, history of lung cancer.

EXAM:
CHEST  2 VIEW

[Series 1: dg chest 2 view · 0.14mm/px · 2 of 2 slices shown]
[im 1/2]
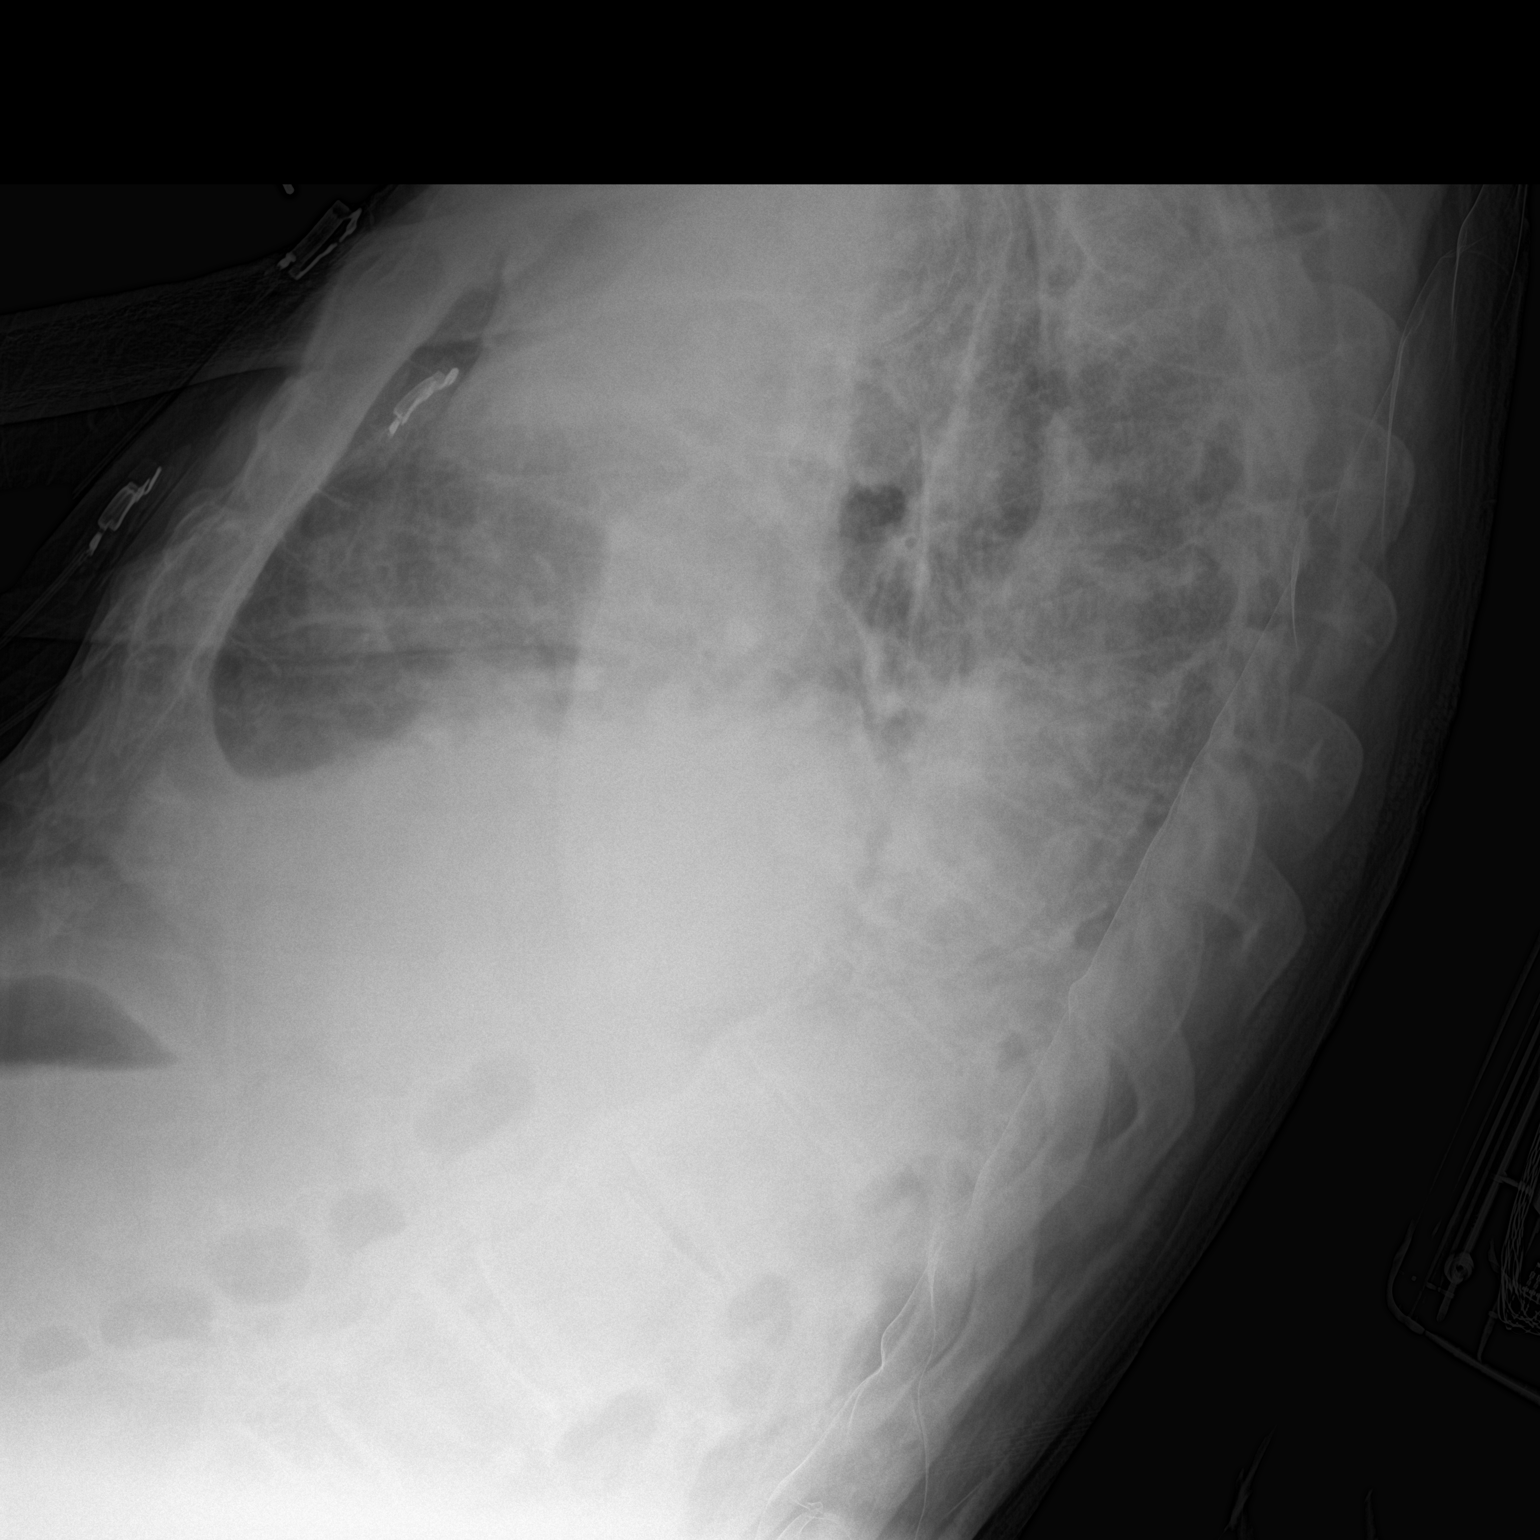
[im 2/2]
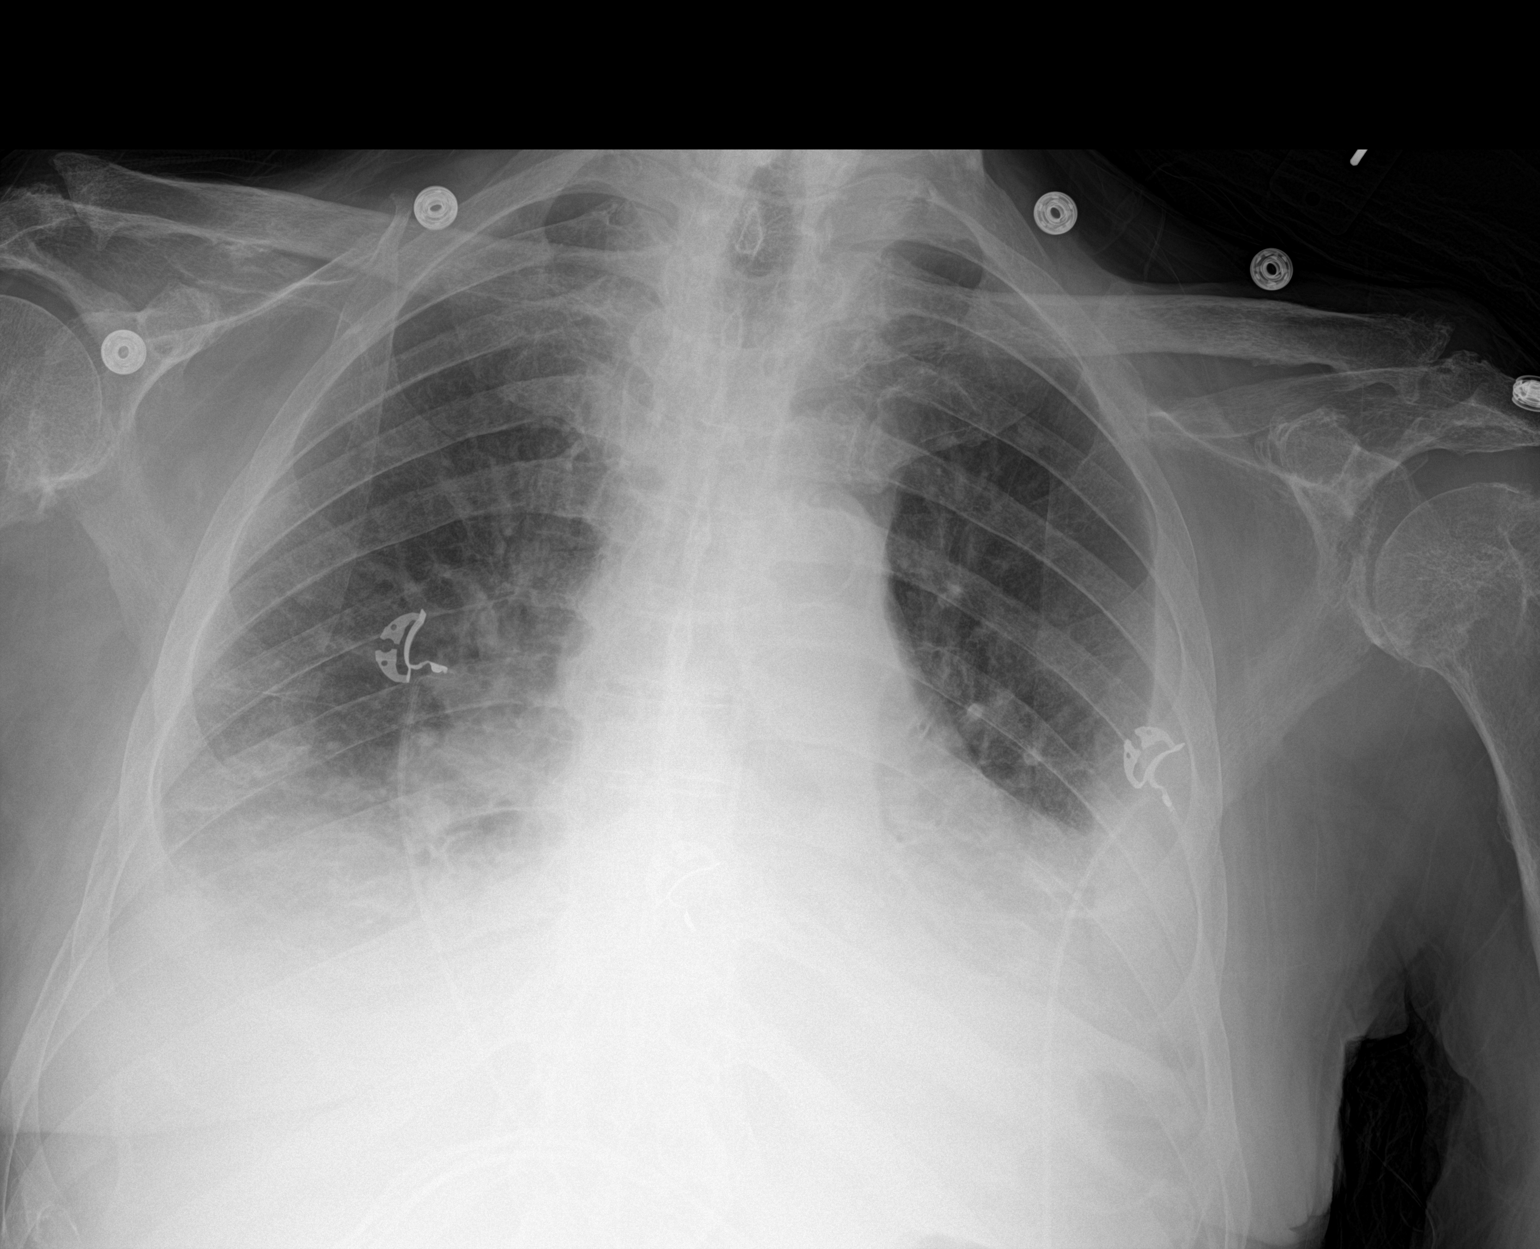

[2 of 2 positions shown; findings below may reference images not displayed]

FINDINGS: Lungs are adequately inflated with stable opacification the left
base likely moderate size effusion with atelectasis as there is less
of a loculated component laterally. Worsening right base
opacification likely worsening small to moderate right pleural
effusion with associated atelectasis. Cannot exclude infection in
the lung bases. Cardiomediastinal silhouette and remainder the exam
is unchanged.
IMPRESSION: Stable left base opacification compatible with moderate effusion
with atelectasis. Less loculation laterally. Worsening small to
moderate right pleural effusion likely with associated atelectasis.
Cannot exclude infection in the lung bases.
